# Patient Record
Sex: Female | Born: 1965 | Race: White | Hispanic: No | Marital: Married | Smoking: Current every day smoker
Health system: Southern US, Community
[De-identification: ages and names within clinical notes are randomized; demographics above are authoritative.]

## PROBLEM LIST (undated history)

## (undated) DIAGNOSIS — G4700 Insomnia, unspecified: Secondary | ICD-10-CM

## (undated) DIAGNOSIS — R569 Unspecified convulsions: Secondary | ICD-10-CM

## (undated) DIAGNOSIS — M542 Cervicalgia: Secondary | ICD-10-CM

## (undated) DIAGNOSIS — R350 Frequency of micturition: Secondary | ICD-10-CM

## (undated) DIAGNOSIS — R112 Nausea with vomiting, unspecified: Secondary | ICD-10-CM

## (undated) DIAGNOSIS — J45909 Unspecified asthma, uncomplicated: Secondary | ICD-10-CM

## (undated) DIAGNOSIS — K219 Gastro-esophageal reflux disease without esophagitis: Secondary | ICD-10-CM

## (undated) DIAGNOSIS — R51 Headache: Secondary | ICD-10-CM

## (undated) DIAGNOSIS — Z9889 Other specified postprocedural states: Secondary | ICD-10-CM

## (undated) DIAGNOSIS — G510 Bell's palsy: Secondary | ICD-10-CM

## (undated) DIAGNOSIS — M1990 Unspecified osteoarthritis, unspecified site: Secondary | ICD-10-CM

## (undated) DIAGNOSIS — R519 Headache, unspecified: Secondary | ICD-10-CM

## (undated) DIAGNOSIS — R351 Nocturia: Secondary | ICD-10-CM

## (undated) DIAGNOSIS — M2550 Pain in unspecified joint: Secondary | ICD-10-CM

## (undated) DIAGNOSIS — I10 Essential (primary) hypertension: Secondary | ICD-10-CM

## (undated) DIAGNOSIS — R42 Dizziness and giddiness: Secondary | ICD-10-CM

## (undated) HISTORY — PX: APPENDECTOMY: SHX54

## (undated) HISTORY — PX: CARPAL TUNNEL RELEASE: SHX101

## (undated) HISTORY — PX: CHOLECYSTECTOMY: SHX55

## (undated) HISTORY — PX: OTHER SURGICAL HISTORY: SHX169

## (undated) HISTORY — PX: BACK SURGERY: SHX140

## (undated) HISTORY — PX: ABDOMINAL HYSTERECTOMY: SHX81

## (undated) HISTORY — DX: Headache: R51

## (undated) HISTORY — DX: Bell's palsy: G51.0

## (undated) HISTORY — PX: TONSILLECTOMY: SUR1361

## (undated) HISTORY — DX: Headache, unspecified: R51.9

## (undated) HISTORY — PX: LYMPH NODE BIOPSY: SHX201

---

## 1998-01-18 ENCOUNTER — Ambulatory Visit (HOSPITAL_COMMUNITY): Admission: RE | Admit: 1998-01-18 | Discharge: 1998-01-18 | Payer: Self-pay | Admitting: Obstetrics and Gynecology

## 2009-04-24 ENCOUNTER — Encounter: Payer: Self-pay | Admitting: Internal Medicine

## 2009-04-25 ENCOUNTER — Encounter: Payer: Self-pay | Admitting: Internal Medicine

## 2009-04-25 DIAGNOSIS — R9431 Abnormal electrocardiogram [ECG] [EKG]: Secondary | ICD-10-CM

## 2009-04-26 ENCOUNTER — Ambulatory Visit: Payer: Self-pay | Admitting: Internal Medicine

## 2009-04-26 ENCOUNTER — Encounter: Payer: Self-pay | Admitting: Internal Medicine

## 2009-04-26 ENCOUNTER — Ambulatory Visit (HOSPITAL_COMMUNITY)
Admission: RE | Admit: 2009-04-26 | Discharge: 2009-04-26 | Payer: Self-pay | Source: Home / Self Care | Admitting: Internal Medicine

## 2009-04-26 ENCOUNTER — Ambulatory Visit: Payer: Self-pay | Admitting: Cardiology

## 2009-04-26 ENCOUNTER — Ambulatory Visit: Payer: Self-pay

## 2009-04-26 DIAGNOSIS — I10 Essential (primary) hypertension: Secondary | ICD-10-CM

## 2009-04-27 ENCOUNTER — Telehealth: Payer: Self-pay | Admitting: Internal Medicine

## 2009-04-30 ENCOUNTER — Ambulatory Visit (HOSPITAL_COMMUNITY)
Admission: RE | Admit: 2009-04-30 | Discharge: 2009-04-30 | Payer: Self-pay | Source: Home / Self Care | Admitting: Neurosurgery

## 2010-02-26 NOTE — Progress Notes (Signed)
Summary: Need to know if pt is clear for surgery Monday  Phone Note From Other Clinic   Caller: D.Jenkins /[phone] Darl Pikes Summary of Call: Dr.Jenkins office calling to see if the pt has been clear for surgery Initial call taken by: Judie Grieve,  April 27, 2009 1:35 PM  Follow-up for Phone Call        spoke with Darl Pikes per Dr Ladona Ridgel ok for surgery.  Echo faxed to Dr Dara Lords, RN, BSN  April 27, 2009 1:50 PM     Appended Document: Need to know if pt is clear for surgery Monday per Dr Ladona Ridgel low risk for surgery okay to proceed

## 2010-02-26 NOTE — Letter (Signed)
Summary: [facility]   [facility]   Imported By: Roderic Ovens 06/19/2009 14:34:02  _____________________________________________________________________  External Attachment:    Type:   Image     Comment:   External Document

## 2010-02-26 NOTE — Assessment & Plan Note (Signed)
Summary: sur/abnormal ekg/kfw   History of Present Illness: Megan Ochoa is referred today for pre-op evaluation prior to lumbar spine surgery.  She has a h/o HTN, tobacco abuse and arthritis.  She has a strong family h/o CAD.  She has been bothered by lower back pain for years and has been scheduled for spinal surgery in a few days.  She denies c/p or sob.  She is fairly active performing her activities of daily living without difficulty.  She does not lift anything heavy.  She has never had syncope.  She admits to a h/o tobacco abuse but is trying to stop smoking.  Current Medications (verified): 1)  Hydrocodone-Acetaminophen 7.5-500 Mg/58ml Soln (Hydrocodone-Acetaminophen) .... As Needed 2)  Promethazine Hcl 25 Mg Tabs (Promethazine Hcl) .... As Needed 3)  Omeprazole 20 Mg Cpdr (Omeprazole) .... Take One Tablet By Mouth Once Daily. 4)  Cyclobenzaprine Hcl 10 Mg Tabs (Cyclobenzaprine Hcl) .... Take One Tablet By Mouth Once Daily. 5)  Cymbalta 60 Mg Cpep (Duloxetine Hcl) .... 2 Tablets Once Daily 6)  Diovan 320 Mg Tabs (Valsartan) .... Take One Tablet By Mouth Daily 7)  Trazodone Hcl 50 Mg Tabs (Trazodone Hcl) .... Take One Tablet By Mouth Once Daily. 8)  Lorazepam 0.5 Mg Tabs (Lorazepam) .... As Needed 9)  Meloxicam 15 Mg Tabs (Meloxicam) .... Take One Tablet By Mouth Once Daily. 10)  Proair Hfa 108 (90 Base) Mcg/act Aers (Albuterol Sulfate) .... As Needed 11)  Risperdal 1 Mg Tabs (Risperidone) .... Take One Tablet By Mouth Twice Daily.  Allergies (verified): 1)  ! Lodine  Past History:  Past Medical History: Current Problems:  ABNORMAL ELECTROCARDIOGRAM (ICD-794.31) HTN GERD    Past Surgical History: unknown  Family History: Father and sister with CAD  Social History: Ongoing tobacco abuse Denies ETOH abuse  Review of Systems       All systems reviewed and negative except as noted in the HPI.  Vital Signs:  Patient profile:   45 year old female Height:      67  inches Weight:      228 pounds BMI:     35.84 Pulse rate:   82 / minute BP sitting:   112 / 70  (left arm)  Vitals Entered By: Laurance Flatten CMA (April 26, 2009 8:48 AM)  Physical Exam  General:  Obese, well developed, well nourished, in no acute distress.  HEENT: normal Neck: supple. No JVD. Carotids 2+ bilaterally no bruits Cor: RRR no rubs, gallops or murmur Lungs: CTA Ab: soft, nontender. nondistended. No HSM. Good bowel sounds Ext: warm. no cyanosis, clubbing or edema Neuro: alert and oriented. Grossly nonfocal. affect pleasant    EKG  Procedure date:  04/26/2009  Findings:      Normal sinus rhythm with rate of:  63 with poor R wave progression  Impression & Recommendations:  Problem # 1:  ABNORMAL ELECTROCARDIOGRAM (ICD-794.31) Her abnormal ECG demonstrates poor R wave progression. This may not indicate CAD but I would recommend obtaining a 2D echo to confirm that her LV function is normal. If so, we will allow her to proceed with surgery. Orders: Echocardiogram (Echo)  Problem # 2:  ESSENTIAL HYPERTENSION, BENIGN (ICD-401.1) Her blood pressure is well controlled today and she will continue her current meds. Her updated medication list for this problem includes:    Diovan 320 Mg Tabs (Valsartan) .Marland Kitchen... Take one tablet by mouth daily  Patient Instructions: 1)  Your physician has requested that you have an echocardiogram.  Echocardiography  is a painless test that uses sound waves to create images of your heart. It provides your doctor with information about the size and shape of your heart and how well your heart's chambers and valves are working.  This procedure takes approximately one hour. There are no restrictions for this procedure.

## 2010-04-22 LAB — CBC
HCT: 45.6 % (ref 36.0–46.0)
MCHC: 33.8 g/dL (ref 30.0–36.0)
Platelets: 210 10*3/uL (ref 150–400)
RBC: 5.2 MIL/uL — ABNORMAL HIGH (ref 3.87–5.11)
RDW: 15.2 % (ref 11.5–15.5)
WBC: 9.3 10*3/uL (ref 4.0–10.5)

## 2010-04-22 LAB — BASIC METABOLIC PANEL
BUN: 5 mg/dL — ABNORMAL LOW (ref 6–23)
Calcium: 9.7 mg/dL (ref 8.4–10.5)
Creatinine, Ser: 0.95 mg/dL (ref 0.4–1.2)
GFR calc Af Amer: >60 mL/min (ref >60)
GFR calc non Af Amer: >60 mL/min (ref >60)
Sodium: 138 mEq/L (ref 135–145)

## 2011-08-14 ENCOUNTER — Other Ambulatory Visit: Payer: Self-pay | Admitting: Neurosurgery

## 2011-08-19 ENCOUNTER — Encounter (HOSPITAL_COMMUNITY): Payer: Self-pay | Admitting: Pharmacy Technician

## 2011-08-19 NOTE — Pre-Procedure Instructions (Addendum)
20 AKSHARA BLUMENTHAL  08/19/2011   Your procedure is scheduled on:  Mon, July 29 @ 7:30 AM  Report to [facility] at 5:30 AM.  Call this number if you have problems the morning of surgery: [phone]   Remember:   Do not eat food or drink:After Midnight.    Take these medicines the morning of surgery with A SIP OF WATER: Pain Pill(if needed) and Omeprazole(Prilosec)Estradiol,Tizanidine(Zanaflex)if needed   Do not wear jewelry, make-up or nail polish.  Do not wear lotions, powders, or perfumes.   Do not shave 48 hours prior to surgery.  Do not bring valuables to the hospital.  Contacts, dentures or bridgework may not be worn into surgery.  Leave suitcase in the car. After surgery it may be brought to your room.  For patients admitted to the hospital, checkout time is 11:00 AM the day of discharge.   Patients discharged the day of surgery will not be allowed to drive home.  Special Instructions: CHG Shower Use Special Wash: 1/2 bottle night before surgery and 1/2 bottle morning of surgery.   Please read over the following fact sheets that you were given: Pain Booklet, Coughing and Deep Breathing, Blood Transfusion Information, MRSA Information and Surgical Site Infection Prevention

## 2011-08-20 ENCOUNTER — Encounter (HOSPITAL_COMMUNITY)
Admission: RE | Admit: 2011-08-20 | Discharge: 2011-08-20 | Disposition: A | Discharge status: Home / Self Care | Payer: Medicare Other | Source: Ambulatory Visit | Attending: Neurosurgery | Admitting: Neurosurgery

## 2011-08-20 ENCOUNTER — Encounter (HOSPITAL_COMMUNITY): Payer: Self-pay | Admitting: Vascular Surgery

## 2011-08-20 ENCOUNTER — Ambulatory Visit (HOSPITAL_COMMUNITY)
Admission: RE | Admit: 2011-08-20 | Discharge: 2011-08-20 | Disposition: A | Discharge status: Home / Self Care | Payer: Medicare Other | Source: Ambulatory Visit | Attending: Neurosurgery | Admitting: Neurosurgery

## 2011-08-20 ENCOUNTER — Encounter (HOSPITAL_COMMUNITY): Payer: Self-pay

## 2011-08-20 DIAGNOSIS — Z01812 Encounter for preprocedural laboratory examination: Secondary | ICD-10-CM | POA: Insufficient documentation

## 2011-08-20 DIAGNOSIS — Z01811 Encounter for preprocedural respiratory examination: Secondary | ICD-10-CM | POA: Insufficient documentation

## 2011-08-20 DIAGNOSIS — Z01818 Encounter for other preprocedural examination: Secondary | ICD-10-CM | POA: Insufficient documentation

## 2011-08-20 HISTORY — DX: Nausea with vomiting, unspecified: R11.2

## 2011-08-20 HISTORY — DX: Insomnia, unspecified: G47.00

## 2011-08-20 HISTORY — DX: Frequency of micturition: R35.0

## 2011-08-20 HISTORY — DX: Unspecified convulsions: R56.9

## 2011-08-20 HISTORY — DX: Gastro-esophageal reflux disease without esophagitis: K21.9

## 2011-08-20 HISTORY — DX: Other specified postprocedural states: Z98.890

## 2011-08-20 HISTORY — DX: Nocturia: R35.1

## 2011-08-20 HISTORY — DX: Unspecified asthma, uncomplicated: J45.909

## 2011-08-20 HISTORY — DX: Essential (primary) hypertension: I10

## 2011-08-20 HISTORY — DX: Cervicalgia: M54.2

## 2011-08-20 HISTORY — DX: Dizziness and giddiness: R42

## 2011-08-20 HISTORY — DX: Pain in unspecified joint: M25.50

## 2011-08-20 HISTORY — DX: Unspecified osteoarthritis, unspecified site: M19.90

## 2011-08-20 LAB — BASIC METABOLIC PANEL
BUN: 10 mg/dL (ref 6–23)
Chloride: 96 mEq/L (ref 96–112)
GFR calc Af Amer: 82 mL/min — ABNORMAL LOW (ref >90)
Glucose, Bld: 80 mg/dL (ref 70–99)
Potassium: 4 mEq/L (ref 3.5–5.1)

## 2011-08-20 LAB — ABO/RH: ABO/RH(D): A POS

## 2011-08-20 LAB — CBC
HCT: 46.8 % — ABNORMAL HIGH (ref 36.0–46.0)
Hemoglobin: 16.4 g/dL — ABNORMAL HIGH (ref 12.0–15.0)
MCHC: 35 g/dL (ref 30.0–36.0)
RBC: 5.39 MIL/uL — ABNORMAL HIGH (ref 3.87–5.11)

## 2011-08-20 LAB — TYPE AND SCREEN: Antibody Screen: NEGATIVE

## 2011-08-20 NOTE — Consult Note (Signed)
Anesthesia Consult:  Patient is a 46 year old female scheduled for two level PLIF on 08/25/11 by Dr. Lovell Sheehan.  History includes smoking, obesity with BMI 37, HTN, seizures (not since age 57 and no longer on anti-seizure medications), GERD, asthma, arthritis, dizziness, hysterectomy, right L4-5 diskectomy 04/30/09.  She asked to speak to an Anesthesia representative during her PAT visit to discuss her waking up during a procedure.  Upon further questioning, I learned that the procedure was a coloscopy done at [facility] on 02/13/10.  It was done under sedation (Fentanyl 250 mcg, Versed 15 mg) not general anesthesia.  (Records on her chart.)  She has never experienced awareness under GA.  Prior to her back surgery in 2011 she was evaluated by Cardiologist Dr. Lewayne Bunting for EKG showing poor r wave progression.  He recommended an echocardiogram which was done on 04/26/09 and showed: - Left ventricle: The cavity size was normal. Wall thickness was normal. Systolic function was normal. The estimated ejection fraction was in the range of 55% to 60%. Wall motion was normal; there were no regional wall motion abnormalities. - Pericardium, extracardiac: A trivial pericardial effusion was identified. - Trivial tricuspid and pulmonic regurgitation. She was ultimately cleared for that procedure.  EKG on 08/20/11 showed NSR, possible LAE.  CXR on 08/20/11 showed slightly prominent chronic lung markings. No change since the previous study. No active disease identified.  Labs noted.  Plan to proceed.  Shonna Chock, PA-C

## 2011-08-20 NOTE — Progress Notes (Signed)
Stress Test requested from [facility].

## 2011-08-24 MED ORDER — CEFAZOLIN SODIUM-DEXTROSE 2-3 GM-% IV SOLR
2.0000 g | INTRAVENOUS | Status: AC
  Administered 2011-08-25: 2 g via INTRAVENOUS
  Filled 2011-08-24: qty 50

## 2011-08-25 ENCOUNTER — Inpatient Hospital Stay (HOSPITAL_COMMUNITY)
Admission: RE | Admit: 2011-08-25 | Discharge: 2011-08-28 | DRG: 756 | Disposition: A | Discharge status: Home / Self Care | Payer: BC Managed Care – PPO | Source: Ambulatory Visit | Attending: Neurosurgery | Admitting: Neurosurgery

## 2011-08-25 ENCOUNTER — Encounter (HOSPITAL_COMMUNITY): Payer: Self-pay | Admitting: *Deleted

## 2011-08-25 ENCOUNTER — Encounter (HOSPITAL_COMMUNITY): Payer: Self-pay | Admitting: Vascular Surgery

## 2011-08-25 ENCOUNTER — Ambulatory Visit (HOSPITAL_COMMUNITY): Payer: BC Managed Care – PPO | Admitting: Vascular Surgery

## 2011-08-25 ENCOUNTER — Ambulatory Visit (HOSPITAL_COMMUNITY): Payer: BC Managed Care – PPO

## 2011-08-25 ENCOUNTER — Encounter (HOSPITAL_COMMUNITY)
Admission: RE | Disposition: A | Discharge status: Home / Self Care | Payer: Self-pay | Source: Ambulatory Visit | Attending: Neurosurgery

## 2011-08-25 DIAGNOSIS — M5136 Other intervertebral disc degeneration, lumbar region: Secondary | ICD-10-CM

## 2011-08-25 DIAGNOSIS — F17200 Nicotine dependence, unspecified, uncomplicated: Secondary | ICD-10-CM | POA: Diagnosis present

## 2011-08-25 DIAGNOSIS — M5137 Other intervertebral disc degeneration, lumbosacral region: Principal | ICD-10-CM | POA: Diagnosis present

## 2011-08-25 DIAGNOSIS — M51379 Other intervertebral disc degeneration, lumbosacral region without mention of lumbar back pain or lower extremity pain: Principal | ICD-10-CM | POA: Diagnosis present

## 2011-08-25 DIAGNOSIS — J45909 Unspecified asthma, uncomplicated: Secondary | ICD-10-CM | POA: Diagnosis present

## 2011-08-25 DIAGNOSIS — M51369 Other intervertebral disc degeneration, lumbar region without mention of lumbar back pain or lower extremity pain: Secondary | ICD-10-CM

## 2011-08-25 DIAGNOSIS — K219 Gastro-esophageal reflux disease without esophagitis: Secondary | ICD-10-CM | POA: Diagnosis present

## 2011-08-25 SURGERY — POSTERIOR LUMBAR FUSION 2 LEVEL
Anesthesia: General | Site: Spine Lumbar | Wound class: Clean

## 2011-08-25 MED ORDER — DOCUSATE SODIUM 100 MG PO CAPS
100.0000 mg | ORAL_CAPSULE | Freq: Two times a day (BID) | ORAL | Status: DC
  Administered 2011-08-25 – 2011-08-28 (×7): 100 mg via ORAL
  Filled 2011-08-25 (×7): qty 1

## 2011-08-25 MED ORDER — DEXAMETHASONE SODIUM PHOSPHATE 4 MG/ML IJ SOLN
INTRAMUSCULAR | Status: DC | PRN
  Administered 2011-08-25: 4 mg via INTRAVENOUS

## 2011-08-25 MED ORDER — CEFAZOLIN SODIUM-DEXTROSE 2-3 GM-% IV SOLR
2.0000 g | Freq: Three times a day (TID) | INTRAVENOUS | Status: AC
  Administered 2011-08-25 (×2): 2 g via INTRAVENOUS
  Filled 2011-08-25 (×2): qty 50

## 2011-08-25 MED ORDER — IRBESARTAN 300 MG PO TABS
300.0000 mg | ORAL_TABLET | Freq: Every day | ORAL | Status: DC
  Administered 2011-08-25 – 2011-08-28 (×3): 300 mg via ORAL
  Filled 2011-08-25 (×4): qty 1

## 2011-08-25 MED ORDER — PANTOPRAZOLE SODIUM 40 MG PO TBEC
40.0000 mg | DELAYED_RELEASE_TABLET | Freq: Every day | ORAL | Status: DC
  Administered 2011-08-25 – 2011-08-28 (×4): 40 mg via ORAL
  Filled 2011-08-25 (×5): qty 1

## 2011-08-25 MED ORDER — LIDOCAINE HCL 4 % MT SOLN
OROMUCOSAL | Status: DC | PRN
  Administered 2011-08-25: 4 mL via TOPICAL

## 2011-08-25 MED ORDER — HYDROCHLOROTHIAZIDE 25 MG PO TABS
25.0000 mg | ORAL_TABLET | Freq: Every day | ORAL | Status: DC
  Administered 2011-08-25 – 2011-08-28 (×2): 25 mg via ORAL
  Filled 2011-08-25 (×4): qty 1

## 2011-08-25 MED ORDER — MIDAZOLAM HCL 5 MG/5ML IJ SOLN
INTRAMUSCULAR | Status: DC | PRN
  Administered 2011-08-25: 2 mg via INTRAVENOUS

## 2011-08-25 MED ORDER — BUPIVACAINE LIPOSOME 1.3 % IJ SUSP
INTRAMUSCULAR | Status: DC | PRN
  Administered 2011-08-25: 20 mL

## 2011-08-25 MED ORDER — GLYCOPYRROLATE 0.2 MG/ML IJ SOLN
INTRAMUSCULAR | Status: DC | PRN
  Administered 2011-08-25: .4 mg via INTRAVENOUS

## 2011-08-25 MED ORDER — SODIUM CHLORIDE 0.9 % IJ SOLN: 9.0000 mL | INTRAMUSCULAR | Status: DC | PRN

## 2011-08-25 MED ORDER — DIAZEPAM 5 MG PO TABS
5.0000 mg | ORAL_TABLET | Freq: Four times a day (QID) | ORAL | Status: DC | PRN
  Administered 2011-08-25 – 2011-08-28 (×7): 5 mg via ORAL
  Filled 2011-08-25 (×6): qty 1

## 2011-08-25 MED ORDER — LACTATED RINGERS IV SOLN
INTRAVENOUS | Status: DC | PRN
  Administered 2011-08-25 (×3): via INTRAVENOUS

## 2011-08-25 MED ORDER — EPHEDRINE SULFATE 50 MG/ML IJ SOLN
INTRAMUSCULAR | Status: DC | PRN
  Administered 2011-08-25 (×4): 10 mg via INTRAVENOUS

## 2011-08-25 MED ORDER — VALSARTAN-HYDROCHLOROTHIAZIDE 320-25 MG PO TABS: 1.0000 | ORAL_TABLET | Freq: Every day | ORAL | Status: DC

## 2011-08-25 MED ORDER — DIPHENHYDRAMINE HCL 50 MG/ML IJ SOLN: 12.5000 mg | Freq: Four times a day (QID) | INTRAMUSCULAR | Status: DC | PRN

## 2011-08-25 MED ORDER — SCOPOLAMINE 1 MG/3DAYS TD PT72
MEDICATED_PATCH | TRANSDERMAL | Status: DC | PRN
  Administered 2011-08-25: 1 via TRANSDERMAL

## 2011-08-25 MED ORDER — ACETAMINOPHEN 325 MG PO TABS: 650.0000 mg | ORAL_TABLET | ORAL | Status: DC | PRN

## 2011-08-25 MED ORDER — PHENOL 1.4 % MT LIQD: 1.0000 | OROMUCOSAL | Status: DC | PRN

## 2011-08-25 MED ORDER — 0.9 % SODIUM CHLORIDE (POUR BTL) OPTIME
TOPICAL | Status: DC | PRN
  Administered 2011-08-25: 1000 mL

## 2011-08-25 MED ORDER — ESTRADIOL 1 MG PO TABS
1.5000 mg | ORAL_TABLET | Freq: Every day | ORAL | Status: DC
  Administered 2011-08-25 – 2011-08-28 (×4): 1.5 mg via ORAL
  Filled 2011-08-25 (×4): qty 1.5

## 2011-08-25 MED ORDER — PROMETHAZINE HCL 25 MG PO TABS
25.0000 mg | ORAL_TABLET | Freq: Four times a day (QID) | ORAL | Status: DC | PRN
  Administered 2011-08-25 – 2011-08-27 (×2): 25 mg via ORAL
  Filled 2011-08-25 (×2): qty 1

## 2011-08-25 MED ORDER — ZOLPIDEM TARTRATE 5 MG PO TABS: 5.0000 mg | ORAL_TABLET | Freq: Every evening | ORAL | Status: DC | PRN

## 2011-08-25 MED ORDER — ONDANSETRON HCL 4 MG/2ML IJ SOLN
INTRAMUSCULAR | Status: DC | PRN
  Administered 2011-08-25 (×2): 4 mg via INTRAVENOUS

## 2011-08-25 MED ORDER — MORPHINE SULFATE (PF) 1 MG/ML IV SOLN
INTRAVENOUS | Status: AC
  Filled 2011-08-25: qty 25

## 2011-08-25 MED ORDER — DIAZEPAM 5 MG PO TABS
ORAL_TABLET | ORAL | Status: AC
  Filled 2011-08-25: qty 1

## 2011-08-25 MED ORDER — MORPHINE SULFATE (PF) 1 MG/ML IV SOLN
INTRAVENOUS | Status: DC
  Administered 2011-08-25: 25 mg via INTRAVENOUS
  Administered 2011-08-25 (×2): via INTRAVENOUS
  Administered 2011-08-26: 18 mg via INTRAVENOUS
  Administered 2011-08-26: 04:00:00 via INTRAVENOUS
  Administered 2011-08-26: 5.88 mg via INTRAVENOUS
  Administered 2011-08-26: 18:00:00 via INTRAVENOUS
  Administered 2011-08-26: 7 mg via INTRAVENOUS
  Administered 2011-08-26: 19.5 mg via INTRAVENOUS
  Administered 2011-08-27: 1.5 mg via INTRAVENOUS
  Administered 2011-08-27: 16.5 mg via INTRAVENOUS
  Administered 2011-08-27: 05:00:00 via INTRAVENOUS
  Filled 2011-08-25 (×6): qty 25

## 2011-08-25 MED ORDER — NEOSTIGMINE METHYLSULFATE 1 MG/ML IJ SOLN
INTRAMUSCULAR | Status: DC | PRN
  Administered 2011-08-25: 3 mg via INTRAVENOUS

## 2011-08-25 MED ORDER — TRAZODONE HCL 50 MG PO TABS
50.0000 mg | ORAL_TABLET | Freq: Every day | ORAL | Status: DC
  Administered 2011-08-25 – 2011-08-27 (×3): 50 mg via ORAL
  Filled 2011-08-25 (×4): qty 1

## 2011-08-25 MED ORDER — BUPIVACAINE LIPOSOME 1.3 % IJ SUSP
8.0000 mL | Freq: Once | INTRAMUSCULAR | Status: DC
  Filled 2011-08-25: qty 20

## 2011-08-25 MED ORDER — ONDANSETRON HCL 4 MG/2ML IJ SOLN
INTRAMUSCULAR | Status: AC
  Filled 2011-08-25: qty 2

## 2011-08-25 MED ORDER — BUPIVACAINE LIPOSOME 1.3 % IJ SUSP
20.0000 mL | Freq: Once | INTRAMUSCULAR | Status: DC
  Filled 2011-08-25: qty 20

## 2011-08-25 MED ORDER — HYDROMORPHONE HCL PF 1 MG/ML IJ SOLN
INTRAMUSCULAR | Status: AC
  Filled 2011-08-25: qty 1

## 2011-08-25 MED ORDER — LIDOCAINE HCL (CARDIAC) 20 MG/ML IV SOLN
INTRAVENOUS | Status: DC | PRN
  Administered 2011-08-25: 100 mg via INTRAVENOUS

## 2011-08-25 MED ORDER — MENTHOL 3 MG MT LOZG: 1.0000 | LOZENGE | OROMUCOSAL | Status: DC | PRN

## 2011-08-25 MED ORDER — ONDANSETRON HCL 4 MG/2ML IJ SOLN
4.0000 mg | INTRAMUSCULAR | Status: DC | PRN
  Administered 2011-08-25 – 2011-08-26 (×3): 4 mg via INTRAVENOUS
  Filled 2011-08-25 (×3): qty 2

## 2011-08-25 MED ORDER — OXYCODONE-ACETAMINOPHEN 5-325 MG PO TABS
1.0000 | ORAL_TABLET | ORAL | Status: DC | PRN
  Administered 2011-08-27 – 2011-08-28 (×4): 2 via ORAL
  Filled 2011-08-25 (×4): qty 2

## 2011-08-25 MED ORDER — THROMBIN 20000 UNITS EX KIT
PACK | CUTANEOUS | Status: DC | PRN
  Administered 2011-08-25: 09:00:00 via TOPICAL

## 2011-08-25 MED ORDER — HYDROCODONE-ACETAMINOPHEN 5-325 MG PO TABS
1.0000 | ORAL_TABLET | ORAL | Status: DC | PRN
  Administered 2011-08-27 – 2011-08-28 (×4): 2 via ORAL
  Filled 2011-08-25 (×4): qty 2

## 2011-08-25 MED ORDER — HYDROMORPHONE HCL PF 1 MG/ML IJ SOLN
0.2500 mg | INTRAMUSCULAR | Status: DC | PRN
  Administered 2011-08-25 (×4): 0.5 mg via INTRAVENOUS

## 2011-08-25 MED ORDER — ONDANSETRON HCL 4 MG/2ML IJ SOLN: 4.0000 mg | Freq: Four times a day (QID) | INTRAMUSCULAR | Status: DC | PRN

## 2011-08-25 MED ORDER — ONDANSETRON HCL 4 MG/2ML IJ SOLN
4.0000 mg | Freq: Once | INTRAMUSCULAR | Status: AC | PRN
  Administered 2011-08-25: 4 mg via INTRAVENOUS

## 2011-08-25 MED ORDER — ROCURONIUM BROMIDE 100 MG/10ML IV SOLN
INTRAVENOUS | Status: DC | PRN
  Administered 2011-08-25: 30 mg via INTRAVENOUS
  Administered 2011-08-25: 50 mg via INTRAVENOUS

## 2011-08-25 MED ORDER — BACITRACIN ZINC 500 UNIT/GM EX OINT
TOPICAL_OINTMENT | CUTANEOUS | Status: DC | PRN
  Administered 2011-08-25: 1 via TOPICAL

## 2011-08-25 MED ORDER — SODIUM CHLORIDE 0.9 % IV SOLN
INTRAVENOUS | Status: AC
  Filled 2011-08-25: qty 500

## 2011-08-25 MED ORDER — NALOXONE HCL 0.4 MG/ML IJ SOLN: 0.4000 mg | INTRAMUSCULAR | Status: DC | PRN

## 2011-08-25 MED ORDER — SODIUM CHLORIDE 0.9 % IR SOLN
Status: DC | PRN
  Administered 2011-08-25: 09:00:00

## 2011-08-25 MED ORDER — SUFENTANIL CITRATE 50 MCG/ML IV SOLN
INTRAVENOUS | Status: DC | PRN
  Administered 2011-08-25 (×4): 10 ug via INTRAVENOUS
  Administered 2011-08-25: 20 ug via INTRAVENOUS
  Administered 2011-08-25: 10 ug via INTRAVENOUS

## 2011-08-25 MED ORDER — DIPHENHYDRAMINE HCL 12.5 MG/5ML PO ELIX: 12.5000 mg | ORAL_SOLUTION | Freq: Four times a day (QID) | ORAL | Status: DC | PRN

## 2011-08-25 MED ORDER — BUPIVACAINE-EPINEPHRINE PF 0.5-1:200000 % IJ SOLN
INTRAMUSCULAR | Status: DC | PRN
  Administered 2011-08-25: 10 mL

## 2011-08-25 MED ORDER — LACTATED RINGERS IV SOLN
INTRAVENOUS | Status: DC
  Administered 2011-08-25 – 2011-08-26 (×2): via INTRAVENOUS

## 2011-08-25 MED ORDER — BACITRACIN 50000 UNITS IM SOLR
INTRAMUSCULAR | Status: AC
  Filled 2011-08-25: qty 1

## 2011-08-25 MED ORDER — ALBUTEROL SULFATE HFA 108 (90 BASE) MCG/ACT IN AERS
2.0000 | INHALATION_SPRAY | Freq: Four times a day (QID) | RESPIRATORY_TRACT | Status: DC | PRN
  Filled 2011-08-25: qty 6.7

## 2011-08-25 MED ORDER — DROPERIDOL 2.5 MG/ML IJ SOLN
INTRAMUSCULAR | Status: DC | PRN
  Administered 2011-08-25: 0.625 mg via INTRAVENOUS

## 2011-08-25 MED ORDER — PROPOFOL 10 MG/ML IV EMUL
INTRAVENOUS | Status: DC | PRN
  Administered 2011-08-25: 200 mg via INTRAVENOUS

## 2011-08-25 MED ORDER — ACETAMINOPHEN 650 MG RE SUPP: 650.0000 mg | RECTAL | Status: DC | PRN

## 2011-08-25 SURGICAL SUPPLY — 68 items
BAG DECANTER FOR FLEXI CONT (MISCELLANEOUS) ×2 IMPLANT
BENZOIN TINCTURE PRP APPL 2/3 (GAUZE/BANDAGES/DRESSINGS) ×2 IMPLANT
BLADE SURG ROTATE 9660 (MISCELLANEOUS) IMPLANT
BRUSH SCRUB EZ PLAIN DRY (MISCELLANEOUS) ×2 IMPLANT
BUR ACORN 6.0 (BURR) ×2 IMPLANT
BUR MATCHSTICK NEURO 3.0 LAGG (BURR) ×2 IMPLANT
CANISTER SUCTION 2500CC (MISCELLANEOUS) ×2 IMPLANT
CAP REVERE LOCKING (Cap) ×12 IMPLANT
CLOTH BEACON ORANGE TIMEOUT ST (SAFETY) ×2 IMPLANT
CONT SPEC 4OZ CLIKSEAL STRL BL (MISCELLANEOUS) ×4 IMPLANT
COVER BACK TABLE 24X17X13 BIG (DRAPES) ×2 IMPLANT
COVER TABLE BACK 60X90 (DRAPES) IMPLANT
DRAPE C-ARM 42X72 X-RAY (DRAPES) ×4 IMPLANT
DRAPE LAPAROTOMY 100X72X124 (DRAPES) ×2 IMPLANT
DRAPE POUCH INSTRU U-SHP 10X18 (DRAPES) ×2 IMPLANT
DRAPE SURG 17X23 STRL (DRAPES) ×8 IMPLANT
ELECT BLADE 4.0 EZ CLEAN MEGAD (MISCELLANEOUS) ×2
ELECT REM PT RETURN 9FT ADLT (ELECTROSURGICAL) ×2
ELECTRODE BLDE 4.0 EZ CLN MEGD (MISCELLANEOUS) ×1 IMPLANT
ELECTRODE REM PT RTRN 9FT ADLT (ELECTROSURGICAL) ×1 IMPLANT
GAUZE SPONGE 4X4 16PLY XRAY LF (GAUZE/BANDAGES/DRESSINGS) ×2 IMPLANT
GLOVE BIO SURGEON STRL SZ8.5 (GLOVE) ×4 IMPLANT
GLOVE BIOGEL PI IND STRL 7.0 (GLOVE) ×2 IMPLANT
GLOVE BIOGEL PI IND STRL 8 (GLOVE) ×1 IMPLANT
GLOVE BIOGEL PI INDICATOR 7.0 (GLOVE) ×2
GLOVE BIOGEL PI INDICATOR 8 (GLOVE) ×1
GLOVE ECLIPSE 6.5 STRL STRAW (GLOVE) ×2 IMPLANT
GLOVE ECLIPSE 7.5 STRL STRAW (GLOVE) ×2 IMPLANT
GLOVE EXAM NITRILE LRG STRL (GLOVE) IMPLANT
GLOVE EXAM NITRILE MD LF STRL (GLOVE) ×2 IMPLANT
GLOVE EXAM NITRILE XL STR (GLOVE) IMPLANT
GLOVE EXAM NITRILE XS STR PU (GLOVE) IMPLANT
GLOVE SS BIOGEL STRL SZ 6.5 (GLOVE) ×2 IMPLANT
GLOVE SS BIOGEL STRL SZ 8 (GLOVE) ×2 IMPLANT
GLOVE SUPERSENSE BIOGEL SZ 6.5 (GLOVE) ×2
GLOVE SUPERSENSE BIOGEL SZ 8 (GLOVE) ×2
GOWN BRE IMP SLV AUR LG STRL (GOWN DISPOSABLE) ×4 IMPLANT
GOWN BRE IMP SLV AUR XL STRL (GOWN DISPOSABLE) ×4 IMPLANT
GOWN STRL REIN 2XL LVL4 (GOWN DISPOSABLE) ×2 IMPLANT
KIT BASIN OR (CUSTOM PROCEDURE TRAY) ×2 IMPLANT
KIT ROOM TURNOVER OR (KITS) ×2 IMPLANT
NEEDLE HYPO 21X1.5 SAFETY (NEEDLE) ×6 IMPLANT
NEEDLE HYPO 22GX1.5 SAFETY (NEEDLE) ×2 IMPLANT
NS IRRIG 1000ML POUR BTL (IV SOLUTION) ×2 IMPLANT
PACK FOAM VITOSS 10CC (Orthopedic Implant) ×2 IMPLANT
PACK LAMINECTOMY NEURO (CUSTOM PROCEDURE TRAY) ×2 IMPLANT
PAD ARMBOARD 7.5X6 YLW CONV (MISCELLANEOUS) ×10 IMPLANT
PATTIES SURGICAL .5 X1 (DISPOSABLE) IMPLANT
PUTTY 10ML ACTIFUSE ABX (Putty) ×2 IMPLANT
ROD REVERE 7.5MM (Rod) ×4 IMPLANT
SCREW 7.5X50MM (Screw) ×8 IMPLANT
SCREW REVERE 6.35 7.5X40 (Screw) ×4 IMPLANT
SPACER SUSTAIN O 10X26 8MM (Spacer) ×8 IMPLANT
SPONGE GAUZE 4X4 12PLY (GAUZE/BANDAGES/DRESSINGS) ×2 IMPLANT
SPONGE LAP 4X18 X RAY DECT (DISPOSABLE) IMPLANT
SPONGE NEURO XRAY DETECT 1X3 (DISPOSABLE) IMPLANT
SPONGE SURGIFOAM ABS GEL 100 (HEMOSTASIS) ×2 IMPLANT
STRIP CLOSURE SKIN 1/2X4 (GAUZE/BANDAGES/DRESSINGS) ×2 IMPLANT
SUT VIC AB 1 CT1 18XBRD ANBCTR (SUTURE) ×2 IMPLANT
SUT VIC AB 1 CT1 8-18 (SUTURE) ×2
SUT VIC AB 2-0 CP2 18 (SUTURE) ×4 IMPLANT
SYR 20CC LL (SYRINGE) ×2 IMPLANT
SYR 20ML ECCENTRIC (SYRINGE) ×2 IMPLANT
TAPE CLOTH SURG 4X10 WHT LF (GAUZE/BANDAGES/DRESSINGS) ×2 IMPLANT
TOWEL OR 17X24 6PK STRL BLUE (TOWEL DISPOSABLE) ×2 IMPLANT
TOWEL OR 17X26 10 PK STRL BLUE (TOWEL DISPOSABLE) ×2 IMPLANT
TRAY FOLEY CATH 14FRSI W/METER (CATHETERS) ×2 IMPLANT
WATER STERILE IRR 1000ML POUR (IV SOLUTION) ×2 IMPLANT

## 2011-08-25 NOTE — Progress Notes (Signed)
UR COMPLETED  

## 2011-08-25 NOTE — H&P (Signed)
Subjective: The patient is a 46 year old white female who was previously undergone lumbar surgery. She has had chronic back and leg pain. She has failed medical management. She was further worked up with a lumbar MRI which demonstrated this degeneration and stenosis at L4-5 and L5-S1. I discussed the various treatment options with the patient including a 2 level lumbar decompression, instrumentation, and fusion. The patient has weighed the risks, benefits, and alternatives surgery and decided proceed with the operation.  Past Medical History  Diagnosis Date  . Hypertension   . Asthma   . Seizures     none since age 57,not on medication  . Arthritis   . GERD (gastroesophageal reflux disease)   . Urinary frequency   . Nocturia   . Joint pain   . Insomnia   . Neck pain, bilateral   . Dizziness   . PONV (postoperative nausea and vomiting)     woke up during a colonoscopy done under sedation    Past Surgical History  Procedure Date  . Appendectomy   . Tonsillectomy   . Carpal tunnel release   . Cesarean section   . Abdominal hysterectomy   . Cholecystectomy   . Lymph node biopsy   . Back surgery   . Right arm bone fusion     Allergies  Allergen Reactions  . Codeine     nausea  . Detrol (Tolterodine)     Chest pain  . Etodolac     Throat swelling, hives  . Metoprolol Succinate (Metoprolol)     Chest pain    History  Substance Use Topics  . Smoking status: Current Everyday Smoker -- 1.0 packs/day for 20 years    Types: Cigarettes  . Smokeless tobacco: Not on file  . Alcohol Use: No    No family history on file. Prior to Admission medications   Medication Sig Start Date End Date Taking? Authorizing Provider  albuterol (PROVENTIL HFA;VENTOLIN HFA) 108 (90 BASE) MCG/ACT inhaler Inhale 2 puffs into the lungs every 6 (six) hours as needed. For shortness of breath   Yes Historical Provider, MD  estradiol (ESTRACE) 1 MG tablet Take 1.5 mg by mouth daily.   Yes Historical  Provider, MD  HYDROcodone-acetaminophen (NORCO) 10-325 MG per tablet Take 1 tablet by mouth every 8 (eight) hours as needed. Back and neck pain   Yes Historical Provider, MD  ibuprofen (ADVIL,MOTRIN) 200 MG tablet Take 400 mg by mouth every 6 (six) hours as needed. Stiff back   Yes Historical Provider, MD  meloxicam (MOBIC) 15 MG tablet Take 15 mg by mouth every evening.   Yes Historical Provider, MD  omeprazole (PRILOSEC) 20 MG capsule Take 20 mg by mouth daily.   Yes Historical Provider, MD  promethazine (PHENERGAN) 25 MG tablet Take 25 mg by mouth every 6 (six) hours as needed. For nausea   Yes Historical Provider, MD  tiZANidine (ZANAFLEX) 4 MG capsule Take 4 mg by mouth 2 (two) times daily as needed. Leg cramps   Yes Historical Provider, MD  traZODone (DESYREL) 50 MG tablet Take 50 mg by mouth at bedtime.   Yes Historical Provider, MD  valsartan-hydrochlorothiazide (DIOVAN-HCT) 320-25 MG per tablet Take 1 tablet by mouth daily.   Yes Historical Provider, MD  fluconazole (DIFLUCAN) 50 MG tablet Take 50 mg by mouth daily as needed.    Historical Provider, MD     Review of Systems  Positive ROS: As above  All other systems have been reviewed and were otherwise negative  with the exception of those mentioned in the HPI and as above.  Objective: Vital signs in last 24 hours: Temp:  [97.7 F (36.5 C)] 97.7 F (36.5 C) (07/29 0616) Pulse Rate:  [63] 63  (07/29 0616) Resp:  [20] 20  (07/29 0616) BP: (118)/(82) 118/82 mmHg (07/29 0616) SpO2:  [100 %] 100 % (07/29 0616)  General Appearance: Alert, cooperative, no distress, appears stated age Head: Normocephalic, without obvious abnormality, atraumatic Eyes: PERRL, conjunctiva/corneas clear, EOM's intact, fundi benign, both eyes      Ears: Normal TM's and external ear canals, both ears Throat: Lips, mucosa, and tongue normal; teeth and gums normal Neck: Supple, symmetrical, trachea midline, no adenopathy; thyroid: No  enlargement/tenderness/nodules; no carotid bruit or JVD Back: Symmetric, no curvature, ROM normal, no CVA tenderness the patient's lumbar incision is well-healed. Lungs: Clear to auscultation bilaterally, respirations unlabored Heart: Regular rate and rhythm, S1 and S2 normal, no murmur, rub or gallop Abdomen: Soft, non-tender, bowel sounds active all four quadrants, no masses, no organomegaly Extremities: Extremities normal, atraumatic, no cyanosis or edema Pulses: 2+ and symmetric all extremities Skin: Skin color, texture, turgor normal, no rashes or lesions  NEUROLOGIC:   Mental status: alert and oriented, no aphasia, good attention span, Fund of knowledge/ memory ok Motor Exam - grossly normal Sensory Exam - grossly normal Reflexes: Symmetric Coordination - grossly normal Gait - grossly normal Balance - grossly normal Cranial Nerves: I: smell Not tested  II: visual acuity  OS: Normal    OD: Normal   II: visual fields Full to confrontation  II: pupils Equal, round, reactive to light  III,VII: ptosis None  III,IV,VI: extraocular muscles  Full ROM  V: mastication Normal  V: facial light touch sensation  Normal  V,VII: corneal reflex  Present  VII: facial muscle function - upper  Normal  VII: facial muscle function - lower Normal  VIII: hearing Not tested  IX: soft palate elevation  Normal  IX,X: gag reflex Present  XI: trapezius strength  5/5  XI: sternocleidomastoid strength 5/5  XI: neck flexion strength  5/5  XII: tongue strength  Normal    Data Review Lab Results  Component Value Date   WBC 11.3* 08/20/2011   HGB 16.4* 08/20/2011   HCT 46.8* 08/20/2011   MCV 86.8 08/20/2011   PLT 240 08/20/2011   Lab Results  Component Value Date   NA 136 08/20/2011   K 4.0 08/20/2011   CL 96 08/20/2011   CO2 27 08/20/2011   BUN 10 08/20/2011   CREATININE 0.96 08/20/2011   GLUCOSE 80 08/20/2011   No results found for this basename: INR, PROTIME    Assessment/Plan:  L4-5 and  L5-S1 disc degeneration, stenosis, lumbar radiculopathy, lumbago: I discussed situation with the patient. I reviewed her MR scan with her and pointed out the abnormalities. We have discussed the various treatment options including a L4-5 and L5-S1 decompression, instrumentation, and fusion. I described the surgery to her. I've shown her surgical models. We have discussed the risks, benefits, alternatives and likelihood of achieving our goals with surgery. I have answered all the patient's questions. She wants to proceed with the operation.   Megan Ochoa 08/25/2011 7:18 AM

## 2011-08-25 NOTE — Anesthesia Postprocedure Evaluation (Signed)
  Anesthesia Post-op Note  Patient: Megan Ochoa  Procedure(s) Performed: Procedure(s) (LRB): POSTERIOR LUMBAR FUSION 2 LEVEL (N/A)  Patient Location: PACU  Anesthesia Type: General  Level of Consciousness: awake, alert , oriented and patient cooperative  Airway and Oxygen Therapy: Patient Spontanous Breathing and Patient connected to nasal cannula oxygen  Post-op Pain: moderate  Post-op Assessment: Post-op Vital signs reviewed, Patient's Cardiovascular Status Stable, Respiratory Function Stable, Patent Airway, No signs of Nausea or vomiting and Pain level controlled  Post-op Vital Signs: stable  Complications: No apparent anesthesia complications

## 2011-08-25 NOTE — Transfer of Care (Signed)
Immediate Anesthesia Transfer of Care Note  Patient: Megan Ochoa  Procedure(s) Performed: Procedure(s) (LRB): POSTERIOR LUMBAR FUSION 2 LEVEL (N/A)  Patient Location: PACU  Anesthesia Type: General  Level of Consciousness: sedated, patient cooperative and responds to stimulation  Airway & Oxygen Therapy: Patient Spontanous Breathing and Patient connected to nasal cannula oxygen  Post-op Assessment: Report given to PACU RN, Post -op Vital signs reviewed and stable and Patient moving all extremities  Post vital signs: Reviewed and stable  Complications: No apparent anesthesia complications

## 2011-08-25 NOTE — Progress Notes (Signed)
Pt very uncomfortable and thrashing on arrival, pt. Now falling asleep , wakes up and states shes hurting, then falls back asleep.

## 2011-08-25 NOTE — Anesthesia Preprocedure Evaluation (Addendum)
Anesthesia Evaluation  Patient identified by MRN, date of birth, ID band Patient awake    Reviewed: Allergy & Precautions, H&P , NPO status , Patient's Chart, lab work & pertinent test results  History of Anesthesia Complications (+) PONV  Airway Mallampati: II  Neck ROM: full    Dental  (+) Teeth Intact and Dental Advidsory Given   Pulmonary asthma , Current Smoker,  Smokes 1/2 pk/day X 94yrs         Cardiovascular Exercise Tolerance: Good hypertension, On Medications Rhythm:regular Rate:Normal     Neuro/Psych Seizures -, Well Controlled,  negative psych ROS   GI/Hepatic Neg liver ROS, GERD-  Medicated and Controlled,  Endo/Other  negative endocrine ROS  Renal/GU negative Renal ROS  negative genitourinary   Musculoskeletal   Abdominal   Peds  Hematology negative hematology ROS (+)   Anesthesia Other Findings   Reproductive/Obstetrics negative OB ROS                          Anesthesia Physical Anesthesia Plan  ASA: III  Anesthesia Plan: General ETT   Post-op Pain Management:    Induction: Intravenous  Airway Management Planned: Oral ETT  Additional Equipment:   Intra-op Plan:   Post-operative Plan: Extubation in OR  Informed Consent: I have reviewed the patients History and Physical, chart, labs and discussed the procedure including the risks, benefits and alternatives for the proposed anesthesia with the patient or authorized representative who has indicated his/her understanding and acceptance.   Dental Advisory Given  Plan Discussed with: Anesthesiologist, CRNA and Surgeon  Anesthesia Plan Comments:        Anesthesia Quick Evaluation

## 2011-08-25 NOTE — Op Note (Signed)
Brief history: The patient is a 46 year old white female who's had prior back surgeries. She's had persistent back and leg pain consistent with a lumbar radiculopathy. She has failed medical management and was worked up with a lumbar MRI. This demonstrated the patient had this degeneration and foraminal stenosis at L4-5 and L5-S1. I discussed the various treatment options with the patient including surgery. She has weighed the risks, benefits, and alternatives surgery decided proceed with a redo L4-5 and L5-S1  laminectomy with instrumentation and fusion.  Preoperative diagnosis: L4-5 and L5-S1 Degenerative disc disease, spinal stenosis with compression of the bilateral L4, L5 and S1 nerve roots; lumbago; lumbar radiculopathy  Postoperative diagnosis: The same  Procedure: Bilateral L4 and L5 Laminotomy/foraminotomies to decompress the bilateral L4, L5 and S1 nerve roots(the work required to do this was in addition to the work required to do the posterior lumbar interbody fusion because of the patient's spinal stenosis, facet arthropathy. Etc. requiring a wide decompression of the nerve roots.); L4-5 and L5-S1 posterior lumbar interbody fusion with local morselized autograft bone and Actifusebone graft extender; insertion of interbody prosthesis at L4-5 and L5-S1 (globus peek interbody prosthesis); posterior segmental instrumentation from L4 to S1 with globus titanium pedicle screws and rods; posterior lateral arthrodesis at L4-5 and L5-S1 with local morselized autograft bone and Vitoss bone graft extender.  Surgeon: Dr. Delma Officer  Asst.: Dr. Barbaraann Barthel  Anesthesia: Gen. endotracheal  Estimated blood loss: 250 cc  Drains: None  Locations: None  Description of procedure: The patient was brought to the operating room by the anesthesia team. General endotracheal anesthesia was induced. The patient was turned to the prone position on the Wilson frame. The patient's lumbosacral region was then  prepared with Betadine scrub and Betadine solution. Sterile drapes were applied.  I then injected the area to be incised with Marcaine with epinephrine solution. I then used the scalpel to make a linear midline incision over the L4-5 and L5-S1 interspace. I then used electrocautery to perform a bilateral subperiosteal dissection exposing the spinous process and lamina of L3 down to S1. We encountered scar tissue from the prior operation. We then obtained intraoperative radiograph to confirm our location. We then inserted the Verstrac retractor to provide exposure.  I began the decompression by using the high speed drill to perform laminotomies at L4 and L5 bilaterally. We then used the Kerrison punches to widen the laminotomy and removed the ligamentum flavum at L4-5 and L5-S1 as well as discectomy scar tissue at these levels on the right. We used the Kerrison punches to remove the medial facets at L4-5 and L5-S1. We performed wide foraminotomies about the bilateral L4, L5 and S1 nerve roots completing the decompression.  We now turned our attention to the posterior lumbar interbody fusion. I used a scalpel to incise the intervertebral disc at L4-5 and L5-S1. I then performed a partial intervertebral discectomy at L4-5 and L5-S1 using the pituitary forceps. We prepared the vertebral endplates at L4-5 and L5-S1 for the fusion by removing the soft tissues with the curettes. We then used the trial spacers to pick the appropriate sized interbody prosthesis. We prefilled his prosthesis with a combination of local morselized autograft bone that we obtained during the decompression as well as Actifuse bone graft extender. We inserted the prefilled prosthesis into the interspace at L4-5 and L5-S1. There was a good snug fit of the prosthesis in the interspace. We then filled and the remainder of the intervertebral disc space with local morselized  autograft bone and Actifuse. This completed the posterior lumbar  interbody arthrodesis.  We now turned attention to the instrumentation. Under fluoroscopic guidance we cannulated the bilateral L4, L5 and S1 pedicles with the bone probe. We then removed the bone probe. He then tapped the pedicle with a 6.5 millimeter tap. We then removed the tap. We probed inside the tapped pedicle with a ball probe to rule out cortical breaches. We then inserted a 10.5 x 50 and 7.5 x 40 millimeter pedicle screw into the bilateral L4, L5 and S1 pedicles bilaterally under fluoroscopic guidance. We then palpated along the medial aspect of the pedicles to rule out cortical breaches. There were none. The nerve roots were not injured. We then connected the unilateral pedicle screws with a lordotic rod. We compressed the construct and secured the rod in place with the caps. We then tightened the caps appropriately. This completed the instrumentation from L4-S1.  We now turned our attention to the posterior lateral arthrodesis at L4-5 and L5-S1. We used the high-speed drill to decorticate the remainder of the facets, pars, transverse process at L4-5 and L5-S1. We then applied a combination of local morselized autograft bone and Vitoss bone graft extender over these decorticated posterior lateral structures. This completed the posterior lateral arthrodesis.  We then obtained hemostasis using bipolar electrocautery. We irrigated the wound out with bacitracin solution. We inspected the thecal sac and nerve roots and noted they were well decompressed. We then removed the retractor. We reapproximated patient's thoracolumbar fascia with interrupted #1 Vicryl suture. We reapproximated patient's subcutaneous tissue with interrupted 2-0 Vicryl suture. The reapproximated patient's skin with Steri-Strips and benzoin. The wound was then coated with bacitracin ointment. A sterile dressing was applied. The drapes were removed. The patient was subsequently returned to the supine position where they were extubated  by the anesthesia team. He was then transported to the post anesthesia care unit in stable condition. All sponge instrument and needle counts were correct at the end of this case.

## 2011-08-26 LAB — BASIC METABOLIC PANEL
BUN: 8 mg/dL (ref 6–23)
Calcium: 9.2 mg/dL (ref 8.4–10.5)
Creatinine, Ser: 0.81 mg/dL (ref 0.50–1.10)
GFR calc Af Amer: >90 mL/min (ref >90)
GFR calc non Af Amer: 86 mL/min — ABNORMAL LOW (ref >90)
Glucose, Bld: 97 mg/dL (ref 70–99)
Potassium: 3.7 mEq/L (ref 3.5–5.1)

## 2011-08-26 LAB — CBC
HCT: 40.3 % (ref 36.0–46.0)
Hemoglobin: 13.5 g/dL (ref 12.0–15.0)
MCH: 29.4 pg (ref 26.0–34.0)
MCHC: 33.5 g/dL (ref 30.0–36.0)
RDW: 14.3 % (ref 11.5–15.5)

## 2011-08-26 NOTE — Progress Notes (Signed)
RN notified of low BP 

## 2011-08-26 NOTE — Evaluation (Signed)
Occupational Therapy Evaluation Patient Details Name: Megan Ochoa MRN: [id number] DOB: December 23, 1965 Today's Date: 08/26/2011 Time: 4098-1191 OT Time Calculation (min): 23 min  OT Assessment / Plan / Recommendation Clinical Impression  This 46 y.o. female admitted for L4 - S1 fusion.  Pt. is doing very well first day post op.  Currently she is requiring supervision for basic self care activities.  Anticipate good progress    OT Assessment  Patient needs continued OT Services    Follow Up Recommendations  No OT follow up;Supervision - Intermittent    Barriers to Discharge None    Equipment Recommendations  3 in 1 bedside comode;Rolling walker with 5" wheels    Recommendations for Other Services    Frequency  Min 2X/week    Precautions / Restrictions Precautions Precautions: Back Precaution Booklet Issued: Yes (comment) Precaution Comments: reviewed 3/3 back precautions with pt and handout given Required Braces or Orthoses: Spinal Brace Spinal Brace: Lumbar corset Restrictions Weight Bearing Restrictions: No       ADL  Eating/Feeding: Simulated;Independent Where Assessed - Eating/Feeding: Bed level Grooming: Simulated;Wash/dry hands;Wash/dry face;Teeth care;Supervision/safety Where Assessed - Grooming: Supported standing Upper Body Bathing: Simulated;Set up Where Assessed - Upper Body Bathing: Supported sitting Lower Body Bathing: Simulated;Supervision/safety Where Assessed - Lower Body Bathing: Supported sit to stand Upper Body Dressing: Simulated;Supervision/safety Where Assessed - Upper Body Dressing: Unsupported sitting Lower Body Dressing: Simulated;Performed;Supervision/safety Where Assessed - Lower Body Dressing: Supported sit to Pharmacist, hospital: Buyer, retail Method: Sit to Barista: Raised toilet seat with arms (or 3-in-1 over toilet) Toileting - Clothing Manipulation and Hygiene:  Simulated;Supervision/safety Where Assessed - Engineer, mining and Hygiene: Standing Tub/Shower Transfer: Simulated;Min guard Tub/Shower Transfer Method: Science writer: Walk in Scientist, research (physical sciences) Used: Back brace;Rolling walker Transfers/Ambulation Related to ADLs: supervision ADL Comments: Pt. demonstrates good awareness of back precautions and able to verbalize 3/3 precautions.  Pt. able to cross ankles over knees to don/doff socks.  Pt. instructed in safe technique for LB ADLs.      OT Diagnosis: Generalized weakness;Acute pain  OT Problem List: Decreased strength;Decreased activity tolerance;Pain;Decreased knowledge of use of DME or AE;Decreased knowledge of precautions OT Treatment Interventions: Self-care/ADL training;DME and/or AE instruction;Therapeutic activities;Patient/family education   OT Goals Acute Rehab OT Goals OT Goal Formulation: With patient Time For Goal Achievement: 09/02/11 Potential to Achieve Goals: Good ADL Goals Pt Will Perform Upper Body Bathing: with modified independence;Sitting in shower ADL Goal: Upper Body Bathing - Progress: Goal set today Pt Will Perform Lower Body Bathing: with modified independence;Sit to stand in shower ADL Goal: Lower Body Bathing - Progress: Goal set today Pt Will Perform Upper Body Dressing: with modified independence;Sitting, chair;Sitting, bed ADL Goal: Upper Body Dressing - Progress: Progressing toward goals Pt Will Perform Lower Body Dressing: with modified independence;Sit to stand from bed;Sit to stand from chair ADL Goal: Lower Body Dressing - Progress: Progressing toward goals Pt Will Transfer to Toilet: with modified independence;Ambulation;3-in-1 ADL Goal: Toilet Transfer - Progress: Goal set today Pt Will Perform Toileting - Hygiene: with modified independence;with adaptive equipment;Sit to stand from 3-in-1/toilet ADL Goal: Toileting - Hygiene - Progress: Goal set today Pt Will  Perform Tub/Shower Transfer: with supervision;Ambulation ADL Goal: Tub/Shower Transfer - Progress: Goal set today  Visit Information  Last OT Received On: 08/26/11 Assistance Needed: +1    Subjective Data  Subjective: "I feel really good, except being shakey due to not eating" Patient Stated Goal: To get better   Prior Functioning  Vision/Perception  Home Living Lives With: Spouse;Other (Comment);Son;Daughter (truck driver) Available Help at Discharge: Family;Available 24 hours/day Type of Home: House Home Access: Stairs to enter [employer] of Steps: 4-5 Entrance Stairs-Rails: Right;Left Home Layout: One level Bathroom Shower/Tub: Health visitor: Standard Bathroom Accessibility: Yes How Accessible: Accessible via walker Home Adaptive Equipment: None Prior Function Level of Independence: Independent Able to Take Stairs?: Yes Driving: Yes Vocation: On disability Communication Communication: No difficulties      Cognition  Overall Cognitive Status: Appears within functional limits for tasks assessed/performed Arousal/Alertness: Awake/alert Orientation Level: Appears intact for tasks assessed Behavior During Session: [facility] for tasks performed    Extremity/Trunk Assessment Right Upper Extremity Assessment RUE ROM/Strength/Tone: [facility] for tasks assessed Left Upper Extremity Assessment LUE ROM/Strength/Tone: [facility] for tasks assessed   Mobility Bed Mobility Bed Mobility: Right Sidelying to Sit;Sitting - Scoot to Edge of Bed;Sit to Sidelying Right Right Sidelying to Sit: 5: Supervision;With rails;HOB flat Sitting - Scoot to Edge of Bed: 5: Supervision Sit to Sidelying Right: 5: Supervision;With rail;HOB flat Details for Bed Mobility Assistance: demonstrates good technique Transfers Transfers: Sit to Stand;Stand to Sit Sit to Stand: 5: Supervision Stand to Sit: 5: Supervision   Exercise    Balance    End of Session OT - End of Session Equipment  Utilized During Treatment: Back brace Activity Tolerance: Patient tolerated treatment well Patient left: in bed;with call bell/phone within reach  GO     Eryck Negron M 08/26/2011, 4:33 PM

## 2011-08-26 NOTE — Progress Notes (Signed)
RN notified of abnormal BP

## 2011-08-26 NOTE — Evaluation (Signed)
I have read and agree with the below assessment and treatment plan.  Eleah Lahaie Helen Whitlow PT, DPT Pager: [phone] 

## 2011-08-26 NOTE — Progress Notes (Signed)
Patient ID: Megan Ochoa, female   DOB: 1965-08-02, 46 y.o.   MRN: [id number] Subjective:  The patient is alert and pleasant. She looks well. Her back is appropriate a sore.  Objective: Vital signs in last 24 hours: Temp:  [97.2 F (36.2 C)-97.8 F (36.6 C)] 97.6 F (36.4 C) (07/30 0559) Pulse Rate:  [63-141] 63  (07/30 0559) Resp:  [10-31] 18  (07/30 0559) BP: (100-154)/(55-124) 114/64 mmHg (07/30 0559) SpO2:  [92 %-100 %] 100 % (07/30 0559) Weight:  [107.5 kg (236 lb 15.9 oz)] 107.5 kg (236 lb 15.9 oz) (07/29 1400)  Intake/Output from previous day: 07/29 0701 - 07/30 0700 In: 2200 [I.V.:2200] Out: 673 [Urine:670; Emesis/NG output:3] Intake/Output this shift:    Physical exam the patient is alert and oriented x3. Her strength is normal in her lower extremities.  Lab Results:  [facility] 08/26/11 0520  WBC 16.0*  HGB 13.5  HCT 40.3  PLT 231   BMET  Basename 08/26/11 0520  NA 134*  K 3.7  CL 94*  CO2 32  GLUCOSE 97  BUN 8  CREATININE 0.81  CALCIUM 9.2    Studies/Results: Dg Lumbar Spine 2-3 Views  08/25/2011  *RADIOLOGY REPORT*  Clinical Data: Lower lumbar fusion  DG C-ARM 1-60 MIN,LUMBAR SPINE - 2-3 VIEW  Technique: Spot fluoroscopic intraoperative views,  Comparison:  07/09/2011  Findings: Three spot fluoroscopic intraoperative views were obtained during L4, L5 and L5 S1 posterior fusion and discectomy. Disc spacers present at these two levels.  Normal alignment.  IMPRESSION: Expected appearance during L4-5 and L5 S1 posterior fusion.  Original Report Authenticated By: Judie Petit. Ruel Favors, M.D.   Dg Lumbar Spine 1 View  08/25/2011  *RADIOLOGY REPORT*  Clinical Data: Lumbar fusion  LUMBAR SPINE - 1 VIEW  Comparison: 07/09/2011  Findings: A surgical asthma projects over the L3 inferior articular facet.  Lap sponge in the operative bed.  IMPRESSION: Intraoperative marking at L3.  Original Report Authenticated By: Donavan Burnet, M.D.   Dg C-arm 1-60 Min  08/25/2011   *RADIOLOGY REPORT*  Clinical Data: Lower lumbar fusion  DG C-ARM 1-60 MIN,LUMBAR SPINE - 2-3 VIEW  Technique: Spot fluoroscopic intraoperative views,  Comparison:  07/09/2011  Findings: Three spot fluoroscopic intraoperative views were obtained during L4, L5 and L5 S1 posterior fusion and discectomy. Disc spacers present at these two levels.  Normal alignment.  IMPRESSION: Expected appearance during L4-5 and L5 S1 posterior fusion.  Original Report Authenticated By: Judie Petit. Ruel Favors, M.D.    Assessment/Plan: Postop day #1: The patient is doing well. We will mobilize her with PT and OT. She will likely go home in a couple days.  LOS: 1 day     Javan Gonzaga D 08/26/2011, 7:53 AM
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
## 2011-08-26 NOTE — Progress Notes (Signed)
Small amount of blood noted on patient's bedpad. Dressing reinforced with abd pad. Pt under no s/s of distress. Will contine to monitor.

## 2011-08-26 NOTE — Clinical Social Work Note (Signed)
CSW received consult for SNF. Discussed pt during progression with RN. PT is recommending outpatient PT. Per MD note, pt will discharge home when stable. RNCM is aware and following. CSW is signing off as no further needs identified. Please reconsult if a need arises prior to discharge.   Dede Query, MSW, Theresia Majors [phone]

## 2011-08-26 NOTE — Progress Notes (Signed)
Pt requested foley to be removed this evening. Foley removed. Patient has voided since. Pt has ambulated with minimal assistance and minimal pain to the bathroom several times this shift.

## 2011-08-27 MED ORDER — MORPHINE SULFATE 2 MG/ML IJ SOLN: 2.0000 mg | INTRAMUSCULAR | Status: DC | PRN

## 2011-08-27 MED ORDER — POLYETHYLENE GLYCOL 3350 17 G PO PACK
17.0000 g | PACK | Freq: Every day | ORAL | Status: DC | PRN
  Filled 2011-08-27: qty 1

## 2011-08-27 MED ORDER — POLYETHYLENE GLYCOL 3350 17 G PO PACK
17.0000 g | PACK | Freq: Every day | ORAL | Status: DC
  Administered 2011-08-27 – 2011-08-28 (×2): 17 g via ORAL
  Filled 2011-08-27 (×2): qty 1

## 2011-08-27 MED FILL — Heparin Sodium (Porcine) Inj 1000 Unit/ML: INTRAMUSCULAR | Qty: 30 | Status: AC

## 2011-08-27 MED FILL — Sodium Chloride IV Soln 0.9%: INTRAVENOUS | Qty: 1000 | Status: AC

## 2011-08-27 NOTE — Progress Notes (Signed)
Patient ID: Megan Ochoa, female   DOB: 04-Aug-1965, 46 y.o.   MRN: [id number] Subjective:  The patient is alert and pleasant. Her back is appropriately sore.  Objective: Vital signs in last 24 hours: Temp:  [98 F (36.7 C)-98.7 F (37.1 C)] 98.3 F (36.8 C) (07/31 0545) Pulse Rate:  [77-92] 82  (07/31 0139) Resp:  [13-19] 16  (07/31 0545) BP: (95-113)/(36-76) 113/60 mmHg (07/31 0545) SpO2:  [95 %-100 %] 95 % (07/31 0545)  Intake/Output from previous day: 07/30 0701 - 07/31 0700 In: 1798.9 [P.O.:240; I.V.:1552.9; IV Piggyback:6] Out: -  Intake/Output this shift:    Physical exam the patient is alert and oriented. Her strength is normal in her lower extremity.  Lab Results:  [facility] 08/26/11 0520  WBC 16.0*  HGB 13.5  HCT 40.3  PLT 231   BMET  Basename 08/26/11 0520  NA 134*  K 3.7  CL 94*  CO2 32  GLUCOSE 97  BUN 8  CREATININE 0.81  CALCIUM 9.2    Studies/Results: Dg Lumbar Spine 2-3 Views  08/25/2011  *RADIOLOGY REPORT*  Clinical Data: Lower lumbar fusion  DG C-ARM 1-60 MIN,LUMBAR SPINE - 2-3 VIEW  Technique: Spot fluoroscopic intraoperative views,  Comparison:  07/09/2011  Findings: Three spot fluoroscopic intraoperative views were obtained during L4, L5 and L5 S1 posterior fusion and discectomy. Disc spacers present at these two levels.  Normal alignment.  IMPRESSION: Expected appearance during L4-5 and L5 S1 posterior fusion.  Original Report Authenticated By: Judie Petit. Ruel Favors, M.D.   Dg C-arm 1-60 Min  08/25/2011  *RADIOLOGY REPORT*  Clinical Data: Lower lumbar fusion  DG C-ARM 1-60 MIN,LUMBAR SPINE - 2-3 VIEW  Technique: Spot fluoroscopic intraoperative views,  Comparison:  07/09/2011  Findings: Three spot fluoroscopic intraoperative views were obtained during L4, L5 and L5 S1 posterior fusion and discectomy. Disc spacers present at these two levels.  Normal alignment.  IMPRESSION: Expected appearance during L4-5 and L5 S1 posterior fusion.  Original Report  Authenticated By: Judie Petit. Ruel Favors, M.D.    Assessment/Plan: Postop day #2: The patient is doing well. We will tentatively plan to send her home tomorrow. I will DC the PCA today.  LOS: 2 days     Sotero Brinkmeyer D 08/27/2011, 9:59 AM

## 2011-08-27 NOTE — Progress Notes (Signed)
Physical Therapy Treatment Patient Details Name: Megan Ochoa MRN: [id number] DOB: Jan 07, 1966 Today's Date: 08/27/2011 Time: 4098-1191 PT Time Calculation (min): 23 min  PT Assessment / Plan / Recommendation Comments on Treatment Session  Pt continues to improve with mobility but still requires some cueing to maintain back precautions during mobility. Pt expressed concern about being able to get into bed at home due to bed being high and was told that PT would make sure to see pt before discharge to work on bed mobility.    Follow Up Recommendations  No PT follow up;Supervision for mobility/OOB    Barriers to Discharge        Equipment Recommendations  Rolling walker with 5" wheels    Recommendations for Other Services    Frequency Min 5X/week   Plan Discharge plan needs to be updated    Precautions / Restrictions Precautions Precautions: Back Precaution Comments: Pt able to verbalize 2/3 back precautions, 3/3 with cues. Pt encouraged to review back precautions hand out. Required Braces or Orthoses: Spinal Brace Spinal Brace: Lumbar corset Restrictions Weight Bearing Restrictions: No       Mobility  Bed Mobility Details for Bed Mobility Assistance: Pt standing in bathroom upon presentation. Transfers Transfers: Stand to Sit;Sit to Stand Sit to Stand: 5: Supervision Stand to Sit: 5: Supervision Details for Transfer Assistance: Pt required supervision during transfers with verbal cueing to maintain back precautions during mobility. Ambulation/Gait Ambulation/Gait Assistance: 5: Supervision Ambulation Distance (Feet): 400 Feet Assistive device: Rolling walker Ambulation/Gait Assistance Details: Pt required supervision for safety and verbal cueing to maintain upright posture and remaining in the walker base while ambulating. Gait Pattern: Step-through pattern;Decreased stride length;Decreased trunk rotation Stairs: Yes Stairs Assistance: 4: Min guard Stair Management  Technique: One rail Right;Step to pattern Number of Stairs: 4      PT Goals Acute Rehab PT Goals PT Goal: Sit to Stand - Progress: Progressing toward goal PT Goal: Stand to Sit - Progress: Progressing toward goal PT Goal: Ambulate - Progress: Progressing toward goal PT Goal: Up/Down Stairs - Progress: Progressing toward goal  Visit Information  Last PT Received On: 08/27/11 Assistance Needed: +1    Subjective Data  Subjective: "I am ready to get out of here."   Cognition  Overall Cognitive Status: Appears within functional limits for tasks assessed/performed Arousal/Alertness: Awake/alert Orientation Level: Appears intact for tasks assessed Behavior During Session: [facility] for tasks performed    Balance     End of Session PT - End of Session Equipment Utilized During Treatment: Gait belt;Back brace Activity Tolerance: Patient tolerated treatment well Patient left: in chair;with call bell/phone within reach;with family/visitor present Nurse Communication: Mobility status;Patient requests pain meds   GP     Virginie Josten 08/27/2011, 12:41 PM

## 2011-08-27 NOTE — Progress Notes (Signed)
Occupational Therapy Treatment Patient Details Name: Megan Ochoa MRN: [id number] DOB: [birth year] Today's Date: 08/27/2011 Time: 6578-4696 OT Time Calculation (min): 15 min  OT Assessment / Plan / Recommendation Comments on Treatment Session Pt. progeressing well.  Demonstrates good understanding of back precautions.    Follow Up Recommendations  No OT follow up;Supervision - Intermittent    Barriers to Discharge       Equipment Recommendations  Rolling walker with 5" wheels;3 in 1 bedside comode    Recommendations for Other Services    Frequency Min 2X/week   Plan Discharge plan remains appropriate    Precautions / Restrictions Precautions Precautions: Back Precaution Booklet Issued: Yes (comment) Precaution Comments: Pt. able to verbalize 3/3 back precuations and idependently dons brace Required Braces or Orthoses: Spinal Brace Spinal Brace: Lumbar corset Restrictions Weight Bearing Restrictions: No   Pertinent Vitals/Pain     ADL  Upper Body Bathing: Simulated;Supervision/safety Where Assessed - Upper Body Bathing: Supported standing Lower Body Bathing: Simulated;Supervision/safety Where Assessed - Lower Body Bathing: Unsupported sit to stand Upper Body Dressing: Simulated;Set up Where Assessed - Upper Body Dressing: Unsupported sitting Lower Body Dressing: Simulated;Set up Where Assessed - Lower Body Dressing: Supported sit to stand Toilet Transfer: Research scientist (life sciences) Method: Sit to Barista: Comfort height toilet Toileting - Architect and Hygiene: Performed;Modified independent Where Assessed - Toileting Clothing Manipulation and Hygiene: Standing Tub/Shower Transfer: Landscape architect Method: Science writer: Walk in shower Equipment Used: Back brace;Rolling walker Transfers/Ambulation Related to ADLs: supervision ADL Comments: Pt. able to  teach back appropriate method for LB ADLs.  Pt. able to move sit to stand from comfort height commode to simulate transfer to built in shower seat.  SHe has standard toilet at home and therefore will need 3-in-1 at home.  pt. demonstrates good understanding of back precautions    OT Diagnosis:    OT Problem List:   OT Treatment Interventions:     OT Goals Acute Rehab OT Goals Potential to Achieve Goals: Good ADL Goals ADL Goal: Upper Body Bathing - Progress: Progressing toward goals ADL Goal: Lower Body Bathing - Progress: Progressing toward goals ADL Goal: Upper Body Dressing - Progress: Progressing toward goals ADL Goal: Lower Body Dressing - Progress: Progressing toward goals ADL Goal: Toilet Transfer - Progress: Progressing toward goals ADL Goal: Toileting - Hygiene - Progress: Met ADL Goal: Tub/Shower Transfer - Progress: Met  Visit Information  Last OT Received On: 08/27/11 Assistance Needed: +1    Subjective Data      Prior Functioning       Cognition  Overall Cognitive Status: Appears within functional limits for tasks assessed/performed Arousal/Alertness: Awake/alert Orientation Level: Appears intact for tasks assessed Behavior During Session: [facility] for tasks performed    Mobility Bed Mobility Bed Mobility: Right Sidelying to Sit;Sitting - Scoot to Edge of Bed;Sit to Sidelying Right Right Sidelying to Sit: 6: Modified independent (Device/Increase time);HOB flat Sitting - Scoot to Edge of Bed: 6: Modified independent (Device/Increase time) Sit to Sidelying Right: 6: Modified independent (Device/Increase time);HOB flat Transfers Transfers: Sit to Stand;Stand to Sit Sit to Stand: 5: Supervision Stand to Sit: 5: Supervision   Exercises    Balance    End of Session OT - End of Session Equipment Utilized During Treatment: Back brace Activity Tolerance: Patient tolerated treatment well Patient left: in bed;with call bell/phone within reach;with family/visitor present    GO     Artasia Thang, Ursula Alert M 08/27/2011, 6:26 PM

## 2011-08-27 NOTE — Progress Notes (Signed)
I have read and agree with the below treatment note and plan. Helvi Royals Helen Whitlow PT, DPT Pager: [phone] 

## 2011-08-28 MED ORDER — OXYCODONE-ACETAMINOPHEN 10-325 MG PO TABS: 1.0000 | ORAL_TABLET | ORAL | Status: AC | PRN

## 2011-08-28 MED ORDER — DSS 100 MG PO CAPS: 100.0000 mg | ORAL_CAPSULE | Freq: Two times a day (BID) | ORAL | Status: AC

## 2011-08-28 MED ORDER — DIAZEPAM 5 MG PO TABS: 5.0000 mg | ORAL_TABLET | Freq: Four times a day (QID) | ORAL | Status: AC | PRN

## 2011-08-28 NOTE — Progress Notes (Signed)
Pt dc instructions provided. Pt verbalized understanding. Prescriptions given to pt. Pt iv d/c intact. Pt under no  S/s distress.

## 2011-08-28 NOTE — Discharge Summary (Signed)
Physician Discharge Summary  Patient ID: Megan Ochoa MRN: [id number] DOB/AGE: 46-Dec-1967 45 y.o.  Admit date: 08/25/2011 Discharge date: 08/28/2011  Admission Diagnoses: L4-5 and L5-S1 disc degeneration, lumbago, lumbar radiculopathy, lumbar stenosis.  Discharge Diagnoses: The same Principal Problem:  *Degeneration of lumbar intervertebral disc   Discharged Condition: good  Hospital Course: I admitted the patient to [facility] on 7/29 13. On that day I performed an L4-5 and L5-S1 decompression, each patient, and fusion. The surgery were well.  The patient's postop course was unremarkable and she was discharged home on a one 69. The patient was given oral and written discharge instructions. All her questions were answered.  Consults: None Significant Diagnostic Studies: None Treatments: L4-5 and L5-S1 redo laminectomy, decompression, each patient, and fusion. Discharge Exam: Blood pressure 133/86, pulse 75, temperature 98 F (36.7 C), temperature source Oral, resp. rate 20, height 5\' 7"  (1.702 m), weight 107.5 kg (236 lb 15.9 oz), SpO2 98.00%. The patient is alert and pleasant. Her strength is normal. Her wound is healing well without signs of infection.  Disposition: Home  Discharge Orders    Future Orders Please Complete By Expires   Diet - low sodium heart healthy      Increase activity slowly      Discharge instructions      Comments:   Call [phone] for a followup appointment.   No dressing needed      Call MD for:  temperature >100.4      Call MD for:  persistant nausea and vomiting      Call MD for:  severe uncontrolled pain      Call MD for:  redness, tenderness, or signs of infection (pain, swelling, redness, odor or green/yellow discharge around incision site)      Call MD for:  difficulty breathing, headache or visual disturbances      Call MD for:  hives      Call MD for:  persistant dizziness or light-headedness      Call MD for:  extreme  fatigue        Medication List  As of 08/28/2011  1:51 PM   STOP taking these medications         HYDROcodone-acetaminophen 10-325 MG per tablet      ibuprofen 200 MG tablet      meloxicam 15 MG tablet      tiZANidine 4 MG capsule         TAKE these medications         albuterol 108 (90 BASE) MCG/ACT inhaler   Commonly known as: PROVENTIL HFA;VENTOLIN HFA   Inhale 2 puffs into the lungs every 6 (six) hours as needed. For shortness of breath      diazepam 5 MG tablet   Commonly known as: VALIUM   Take 1 tablet (5 mg total) by mouth every 6 (six) hours as needed.      DSS 100 MG Caps   Take 100 mg by mouth 2 (two) times daily.      estradiol 1 MG tablet   Commonly known as: ESTRACE   Take 1.5 mg by mouth daily.      fluconazole 50 MG tablet   Commonly known as: DIFLUCAN   Take 50 mg by mouth daily as needed.      omeprazole 20 MG capsule   Commonly known as: PRILOSEC   Take 20 mg by mouth daily.      oxyCODONE-acetaminophen 10-325 MG per tablet   Commonly  known as: PERCOCET   Take 1 tablet by mouth every 4 (four) hours as needed for pain.      promethazine 25 MG tablet   Commonly known as: PHENERGAN   Take 25 mg by mouth every 6 (six) hours as needed. For nausea      traZODone 50 MG tablet   Commonly known as: DESYREL   Take 50 mg by mouth at bedtime.      valsartan-hydrochlorothiazide 320-25 MG per tablet   Commonly known as: DIOVAN-HCT   Take 1 tablet by mouth daily.             SignedTressie Stalker D 08/28/2011, 1:51 PM

## 2011-08-28 NOTE — Progress Notes (Signed)
Physical Therapy Treatment Patient Details Name: Megan Ochoa MRN: [id number] DOB: 24-May-1965 Today's Date: 08/28/2011 Time: 0807-0820 PT Time Calculation (min): 13 min  PT Assessment / Plan / Recommendation Comments on Treatment Session  Planning on going home today. After practicing bed mobility today she feels confident about going home with support from her family. Educated pt on option for outpatient PT for improved strengthening once cleared by surgeon if she feels she is still having difficulties. Pt verbalizes understanding. Son and daughter will provide assist at home.     Follow Up Recommendations  No PT follow up    Barriers to Discharge        Equipment Recommendations  Rolling walker with 5" wheels;3 in 1 bedside comode    Recommendations for Other Services    Frequency     Plan Discharge plan remains appropriate;Frequency remains appropriate    Precautions / Restrictions Precautions Precautions: Back Precaution Comments: Pt independently correcting herself duing mobility and verbalizes 3/3 back precautions       Mobility  Bed Mobility Details for Bed Mobility Assistance: Elevated the height of her bed to simulate her personal bed from home, pt able to step up onto step stool backwards in prep to sit on bed with minimal verbal cues to avoid twisting Transfers Sit to Stand: 6: Modified independent (Device/Increase time);With upper extremity assist;From chair/3-in-1;From bed Stand to Sit: 6: Modified independent (Device/Increase time);With upper extremity assist;To chair/3-in-1;To bed Ambulation/Gait Ambulation/Gait Assistance: 6: Modified independent (Device/Increase time) Ambulation Distance (Feet): 400 Feet Assistive device: Rolling walker Ambulation/Gait Assistance Details: appropriately slowed gait because of stiffness Stairs Assistance: 5: Supervision Stairs Assistance Details (indicate cue type and reason): initially utilizing 2 rails with verbal cues for  sequencing, her second attempt pt utlized one rail to simulate home environment still with supervision, no major difficulties Stair Management Technique: Two rails;One rail Right Number of Stairs: 10     Exercises        PT Goals Acute Rehab PT Goals PT Goal: Sit to Stand - Progress: Met PT Goal: Stand to Sit - Progress: Met PT Goal: Ambulate - Progress: Met PT Goal: Up/Down Stairs - Progress: Met  Visit Information  Last PT Received On: 08/28/11 Assistance Needed: +1    Subjective Data  Subjective: I've already been up walking but I'll go again.    Cognition  Overall Cognitive Status: Appears within functional limits for tasks assessed/performed Arousal/Alertness: Awake/alert Orientation Level: Appears intact for tasks assessed Behavior During Session: [facility] for tasks performed    Balance     End of Session PT - End of Session Equipment Utilized During Treatment: Gait belt;Back brace Activity Tolerance: Patient tolerated treatment well Patient left: in chair;with call bell/phone within reach   GP     [facility] HELEN 08/28/2011, 8:28 AM

## 2011-08-29 NOTE — Care Management Note (Signed)
    Page 1 of 1   08/29/2011     12:12:52 PM   CARE MANAGEMENT NOTE 08/29/2011  Patient:  Megan Ochoa, Megan Ochoa   Account Number:  [id number]  Date Initiated:  08/29/2011  Documentation initiated by:  Onnie Boer  Subjective/Objective Assessment:   PT WAS ADMITTED FOR BACK SURGERY     Action/Plan:   PROGRESSION OF CARE AND DISCHARGE PLANNING   Anticipated DC Date:  08/28/2011   Anticipated DC Plan:  HOME/SELF CARE         Choice offered to / List presented to:  C-1 Patient   DME arranged  3-N-1  Levan Hurst      DME agency  [facility]        Status of service:  Completed, signed off Medicare Important Message given?   (If response is "NO", the following Medicare IM given date fields will be blank) Date Medicare IM given:   Date Additional Medicare IM given:    Discharge Disposition:  HOME/SELF CARE  Per UR Regulation:  Reviewed for med. necessity/level of care/duration of stay  If discussed at Long Length of Stay Meetings, dates discussed:    Comments:  08/29/11 Onnie Boer, RN, BSN 1211 PT WAS ADMITTED FOR A PLIF REDO.  PT WAS DC'D TO HOME WITH THE ABOVE DME'S FROM [facility].  PT RECOMMENDED OP PT, NO ORDERS RECEIVED

## 2012-07-08 ENCOUNTER — Emergency Department (HOSPITAL_COMMUNITY)
Admission: EM | Admit: 2012-07-08 | Discharge: 2012-07-08 | Disposition: A | Discharge status: Home / Self Care | Payer: Commercial Managed Care - PPO | Attending: Emergency Medicine | Admitting: Emergency Medicine

## 2012-07-08 ENCOUNTER — Encounter (HOSPITAL_COMMUNITY): Payer: Self-pay

## 2012-07-08 ENCOUNTER — Emergency Department (HOSPITAL_COMMUNITY): Payer: Commercial Managed Care - PPO

## 2012-07-08 DIAGNOSIS — J45909 Unspecified asthma, uncomplicated: Secondary | ICD-10-CM | POA: Insufficient documentation

## 2012-07-08 DIAGNOSIS — Z79899 Other long term (current) drug therapy: Secondary | ICD-10-CM | POA: Insufficient documentation

## 2012-07-08 DIAGNOSIS — Z8739 Personal history of other diseases of the musculoskeletal system and connective tissue: Secondary | ICD-10-CM | POA: Insufficient documentation

## 2012-07-08 DIAGNOSIS — G40909 Epilepsy, unspecified, not intractable, without status epilepticus: Secondary | ICD-10-CM | POA: Insufficient documentation

## 2012-07-08 DIAGNOSIS — R259 Unspecified abnormal involuntary movements: Secondary | ICD-10-CM | POA: Insufficient documentation

## 2012-07-08 DIAGNOSIS — G510 Bell's palsy: Secondary | ICD-10-CM | POA: Insufficient documentation

## 2012-07-08 DIAGNOSIS — K219 Gastro-esophageal reflux disease without esophagitis: Secondary | ICD-10-CM | POA: Insufficient documentation

## 2012-07-08 DIAGNOSIS — R079 Chest pain, unspecified: Secondary | ICD-10-CM | POA: Insufficient documentation

## 2012-07-08 DIAGNOSIS — F17200 Nicotine dependence, unspecified, uncomplicated: Secondary | ICD-10-CM | POA: Insufficient documentation

## 2012-07-08 DIAGNOSIS — M2550 Pain in unspecified joint: Secondary | ICD-10-CM | POA: Insufficient documentation

## 2012-07-08 DIAGNOSIS — I10 Essential (primary) hypertension: Secondary | ICD-10-CM | POA: Insufficient documentation

## 2012-07-08 DIAGNOSIS — R42 Dizziness and giddiness: Secondary | ICD-10-CM | POA: Insufficient documentation

## 2012-07-08 LAB — URINALYSIS, ROUTINE W REFLEX MICROSCOPIC
Glucose, UA: NEGATIVE mg/dL
Hgb urine dipstick: NEGATIVE
Ketones, ur: NEGATIVE mg/dL
Protein, ur: NEGATIVE mg/dL
Urobilinogen, UA: 0.2 mg/dL (ref 0.0–1.0)

## 2012-07-08 LAB — COMPREHENSIVE METABOLIC PANEL
AST: 92 U/L — ABNORMAL HIGH (ref 0–37)
Albumin: 3.3 g/dL — ABNORMAL LOW (ref 3.5–5.2)
Alkaline Phosphatase: 80 U/L (ref 39–117)
BUN: 9 mg/dL (ref 6–23)
CO2: 30 mEq/L (ref 19–32)
Chloride: 96 mEq/L (ref 96–112)
Creatinine, Ser: 0.93 mg/dL (ref 0.50–1.10)
GFR calc non Af Amer: 73 mL/min — ABNORMAL LOW (ref >90)
Potassium: 4.4 mEq/L (ref 3.5–5.1)
Total Bilirubin: 0.9 mg/dL (ref 0.3–1.2)

## 2012-07-08 LAB — CBC WITH DIFFERENTIAL/PLATELET
HCT: 40.7 % (ref 36.0–46.0)
Hemoglobin: 13.8 g/dL (ref 12.0–15.0)
Lymphocytes Relative: 21 % (ref 12–46)
Monocytes Absolute: 0.9 10*3/uL (ref 0.1–1.0)
Monocytes Relative: 8 % (ref 3–12)
Neutro Abs: 7.4 10*3/uL (ref 1.7–7.7)
Neutrophils Relative %: 70 % (ref 43–77)
RBC: 4.65 MIL/uL (ref 3.87–5.11)
WBC: 10.7 10*3/uL — ABNORMAL HIGH (ref 4.0–10.5)

## 2012-07-08 NOTE — ED Provider Notes (Signed)
History     CSN: [id number]  Arrival date & time 07/08/12  1449   First MD Initiated Contact with Patient 07/08/12 1718      No chief complaint on file.   (Consider location/radiation/quality/duration/timing/severity/associated sxs/prior treatment) Patient is a 47 y.o. female presenting with neurologic complaint. The history is provided by the patient.  Neurologic Problem This is a new problem. The current episode started 1 to 4 weeks ago (19 days ago). The problem occurs constantly. The problem has been unchanged. Associated symptoms include arthralgias (hips, knees) and chest pain (intermittent for past 19 days). Pertinent negatives include no abdominal pain, chills, congestion, coughing, fever, headaches, nausea, numbness, sore throat, visual change, vomiting or weakness. Nothing aggravates the symptoms. Treatments tried: antivirals, prednisone taper. The treatment provided no relief.    Past Medical History  Diagnosis Date  . Hypertension   . Asthma   . Seizures     none since age 63,not on medication  . Arthritis   . GERD (gastroesophageal reflux disease)   . Urinary frequency   . Nocturia   . Joint pain   . Insomnia   . Neck pain, bilateral   . Dizziness   . PONV (postoperative nausea and vomiting)     woke up during a colonoscopy done under sedation    Past Surgical History  Procedure Laterality Date  . Appendectomy    . Tonsillectomy    . Carpal tunnel release    . Cesarean section    . Abdominal hysterectomy    . Cholecystectomy    . Lymph node biopsy    . Back surgery    . Right arm bone fusion      History reviewed. No pertinent family history.  History  Substance Use Topics  . Smoking status: Current Every Day Smoker -- 1.00 packs/day for 20 years    Types: Cigarettes  . Smokeless tobacco: Not on file  . Alcohol Use: No    OB History   Grav Para Term Preterm Abortions TAB SAB Ect Mult Living                  Review of Systems   Constitutional: Negative for fever and chills.  HENT: Negative for congestion, sore throat and rhinorrhea.   Eyes: Negative for pain, redness and visual disturbance.  Respiratory: Negative for cough, chest tightness and shortness of breath.   Cardiovascular: Positive for chest pain (intermittent for past 19 days). Negative for palpitations.  Gastrointestinal: Negative for nausea, vomiting, abdominal pain, diarrhea, constipation and rectal pain.  Genitourinary: Negative for dysuria and difficulty urinating.  Musculoskeletal: Positive for arthralgias (hips, knees). Negative for back pain.  Neurological: Positive for dizziness, tremors (both hands) and light-headedness. Negative for weakness, numbness and headaches.  All other systems reviewed and are negative.    Allergies  Bupropion; Codeine; Detrol; Etodolac; Metoprolol succinate; and Tizanidine  Home Medications   Current Outpatient Rx  Name  Route  Sig  Dispense  Refill  . albuterol-ipratropium (COMBIVENT) 18-103 MCG/ACT inhaler   Inhalation   Inhale 2 puffs into the lungs every 6 (six) hours as needed for wheezing.         . calcium carbonate (TUMS - DOSED IN MG ELEMENTAL CALCIUM) 500 MG chewable tablet   Oral   Chew 2 tablets by mouth daily as needed for heartburn.         Marland Kitchen CALCIUM MAGNESIUM 750 PO   Oral   Take 1 tablet by mouth at bedtime.         Marland Kitchen  diazepam (VALIUM) 5 MG tablet   Oral   Take 5 mg by mouth every 8 (eight) hours as needed for anxiety.         . DULoxetine (CYMBALTA) 60 MG capsule   Oral   Take 60 mg by mouth daily.         Marland Kitchen estradiol (ESTRACE) 1 MG tablet   Oral   Take 1.5 mg by mouth daily.         . fluticasone (FLONASE) 50 MCG/ACT nasal spray   Nasal   Place 1 spray into the nose 2 (two) times daily as needed for rhinitis.         Marland Kitchen guaiFENesin-dextromethorphan (ROBITUSSIN DM) 100-10 MG/5ML syrup   Oral   Take 5 mLs by mouth 3 (three) times daily as needed for cough.          Marland Kitchen HYDROcodone-acetaminophen (NORCO) 10-325 MG per tablet   Oral   Take 1 tablet by mouth every 5 (five) hours as needed for pain.         . hydrocortisone cream 1 %   Topical   Apply 1 application topically 2 (two) times daily as needed (to rash).         Marland Kitchen omeprazole (PRILOSEC) 20 MG capsule   Oral   Take 20 mg by mouth daily.         . promethazine (PHENERGAN) 25 MG tablet   Oral   Take 25 mg by mouth every 6 (six) hours as needed. For nausea         . traZODone (DESYREL) 50 MG tablet   Oral   Take 100 mg by mouth at bedtime.          . valsartan-hydrochlorothiazide (DIOVAN-HCT) 320-25 MG per tablet   Oral   Take 1 tablet by mouth daily.           BP 115/67  Pulse 85  Temp(Src) 98.4 F (36.9 C) (Oral)  Resp 18  SpO2 97%  Physical Exam  Nursing note and vitals reviewed. Constitutional: She is oriented to person, place, and time. She appears well-developed and well-nourished. No distress.  HENT:  Head: Normocephalic and atraumatic.  Mouth/Throat: Oropharynx is clear and moist.  Eyes: EOM are normal. Pupils are equal, round, and reactive to light.  Neck: Normal range of motion. Neck supple.  Cardiovascular: Normal rate, regular rhythm and normal heart sounds.  Exam reveals no friction rub.   No murmur heard. Pulmonary/Chest: Effort normal and breath sounds normal. No respiratory distress. She has no wheezes. She has no rales.  Abdominal: Soft. There is no tenderness. There is no rebound and no guarding.  Musculoskeletal: Normal range of motion. She exhibits no edema and no tenderness.  Lymphadenopathy:    She has no cervical adenopathy.  Neurological: She is alert and oriented to person, place, and time.  Right sided facial weakness noted. Unable to fully close right eye. Weakness of smile on right. 5/5 strength in all extremities.   Skin: Skin is warm and dry. No rash noted.  Psychiatric: She has a normal mood and affect. Her behavior is normal.     ED Course  Procedures (including critical care time)  Labs Reviewed  CBC WITH DIFFERENTIAL - Abnormal; Notable for the following:    WBC 10.7 (*)    All other components within normal limits  COMPREHENSIVE METABOLIC PANEL - Abnormal; Notable for the following:    Sodium 133 (*)    Albumin 3.3 (*)  AST 92 (*)    ALT 108 (*)    GFR calc non Af Amer 73 (*)    GFR calc Af Amer 84 (*)    All other components within normal limits  URINALYSIS, ROUTINE W REFLEX MICROSCOPIC - Abnormal; Notable for the following:    APPearance HAZY (*)    All other components within normal limits   Dg Chest 2 View  07/08/2012   *RADIOLOGY REPORT*  Clinical Data: Right facial paralysis.  Weakness.  CHEST - 2 VIEW  Comparison: 08/20/2011.  Findings: Normal sized heart.  Clear lungs.  The interstitial markings remain mildly prominent.  Unremarkable bones. Cholecystectomy clips.  IMPRESSION: Stable mild chronic interstitial lung disease.  No acute abnormality.   Original Report Authenticated By: Beckie Salts, M.D.    Date: 07/09/2012  Rate: 61  Rhythm: normal sinus rhythm  QRS Axis: normal  Intervals: normal  ST/T Wave abnormalities: normal  Conduction Disutrbances:none  Narrative Interpretation:   Old EKG Reviewed: none available     1. Bell's palsy       MDM  70:2 PM 47 year old female presenting with 19 days of right facial weakness. Patient states she was diagnosed with Bell's palsy. She was treated with antivirals and attend a course of prednisone. She states her symptoms have not improved. She has a very long list of various complaints including intermittent chest pain, swelling in both feet, joint pain and hand tremors. Patient noted to have weakness of her upper and lower right face on exam consistent with her history of Bell's palsy. Labs show no significant abnormality. Mild leukoctyosis, however improved from priors. Overall this is c/w bells palsy that has not responded to steroid  therapy. Do not feel this is stroke as she has no risk factors and involves upper and lower facial nerves. She reports multiple episodes of bells palsy and it was explained that given this she can be at increased risk for repeat episodes. It was explained that steroids do not work 100% of the time. She will f/u with her PCP for re-evaluation. She voiced understanding and dc'd home in stable condition.         Caren Hazy, MD 07/09/12 0107  Caren Hazy, MD 07/09/12 [phone]

## 2012-07-08 NOTE — ED Notes (Signed)
Pt presents with multiple complaints.  Pt reports 19 day h/o R sided facial paralysis - pt diagnosed with Bell's Palsy.  Pt reports increased fatigue, R sided neck pain, dizziness, nausea, swelling in L leg and foot, blurred vision in both eyes and bilateral hand tremors. Pt has been seen by PA for all of the above complaints, was placed on steroids and antivirals.  Pt reports her blood pressure has been low since last night.

## 2012-07-08 NOTE — ED Provider Notes (Signed)
I saw and evaluated the patient, reviewed the resident's note and I agree with the findings and plan. Agree with EKG interpretation if present.   Pt with multiple complaints, general weakness. Normal workup here. Hemodynamically stable. Advised close PCP followup for evaluation of her other complaints.   Charles B. Bernette Mayers, MD 07/08/12 2314

## 2012-07-08 NOTE — ED Notes (Signed)
Pt c/o sudden sharp pain under her left breast.

## 2012-07-08 NOTE — ED Notes (Signed)
Pt states understanding of discharge instructions 

## 2012-12-16 ENCOUNTER — Other Ambulatory Visit: Payer: Self-pay | Admitting: Neurosurgery

## 2012-12-16 DIAGNOSIS — M549 Dorsalgia, unspecified: Secondary | ICD-10-CM

## 2012-12-20 ENCOUNTER — Other Ambulatory Visit: Payer: Medicare Other

## 2012-12-27 ENCOUNTER — Ambulatory Visit
Admission: RE | Admit: 2012-12-27 | Discharge: 2012-12-27 | Disposition: A | Discharge status: Home / Self Care | Payer: Commercial Managed Care - PPO | Source: Ambulatory Visit | Attending: Neurosurgery | Admitting: Neurosurgery

## 2012-12-27 VITALS — BP 128/84 | HR 54

## 2012-12-27 DIAGNOSIS — M549 Dorsalgia, unspecified: Secondary | ICD-10-CM

## 2012-12-27 DIAGNOSIS — M5136 Other intervertebral disc degeneration, lumbar region: Secondary | ICD-10-CM

## 2012-12-27 MED ORDER — MEPERIDINE HCL 100 MG/ML IJ SOLN
100.0000 mg | Freq: Once | INTRAMUSCULAR | Status: AC
  Administered 2012-12-27: 100 mg via INTRAMUSCULAR

## 2012-12-27 MED ORDER — ONDANSETRON HCL 8 MG PO TABS
8.0000 mg | ORAL_TABLET | Freq: Once | ORAL | Status: AC
  Administered 2012-12-27: 8 mg via ORAL

## 2012-12-27 MED ORDER — DIAZEPAM 5 MG PO TABS
10.0000 mg | ORAL_TABLET | Freq: Once | ORAL | Status: AC
  Administered 2012-12-27: 10 mg via ORAL

## 2012-12-27 MED ORDER — IOHEXOL 180 MG/ML  SOLN
15.0000 mL | Freq: Once | INTRAMUSCULAR | Status: AC | PRN
  Administered 2012-12-27: 15 mL via INTRATHECAL

## 2012-12-27 NOTE — Progress Notes (Addendum)
Patient states she has been off Wellbutrin, Trazodone, Cymbalta and Promethazine for the past two days.  Patient with complaint of nausea always but worse now that she is off Promethazine for myelogram.  Will medicate with Zofran.  jkl

## 2013-04-15 ENCOUNTER — Encounter (HOSPITAL_COMMUNITY): Payer: Self-pay | Admitting: Emergency Medicine

## 2013-04-15 ENCOUNTER — Emergency Department (HOSPITAL_COMMUNITY)
Admission: EM | Admit: 2013-04-15 | Discharge: 2013-04-15 | Discharge status: Against Medical Advice | Payer: Commercial Managed Care - PPO | Attending: Emergency Medicine | Admitting: Emergency Medicine

## 2013-04-15 ENCOUNTER — Emergency Department (HOSPITAL_COMMUNITY): Payer: Commercial Managed Care - PPO

## 2013-04-15 DIAGNOSIS — Y92410 Unspecified street and highway as the place of occurrence of the external cause: Secondary | ICD-10-CM | POA: Insufficient documentation

## 2013-04-15 DIAGNOSIS — S4980XA Other specified injuries of shoulder and upper arm, unspecified arm, initial encounter: Secondary | ICD-10-CM | POA: Insufficient documentation

## 2013-04-15 DIAGNOSIS — F17200 Nicotine dependence, unspecified, uncomplicated: Secondary | ICD-10-CM | POA: Insufficient documentation

## 2013-04-15 DIAGNOSIS — S59919A Unspecified injury of unspecified forearm, initial encounter: Secondary | ICD-10-CM

## 2013-04-15 DIAGNOSIS — J45909 Unspecified asthma, uncomplicated: Secondary | ICD-10-CM | POA: Insufficient documentation

## 2013-04-15 DIAGNOSIS — I10 Essential (primary) hypertension: Secondary | ICD-10-CM | POA: Insufficient documentation

## 2013-04-15 DIAGNOSIS — S0990XA Unspecified injury of head, initial encounter: Secondary | ICD-10-CM | POA: Insufficient documentation

## 2013-04-15 DIAGNOSIS — S6990XA Unspecified injury of unspecified wrist, hand and finger(s), initial encounter: Secondary | ICD-10-CM | POA: Insufficient documentation

## 2013-04-15 DIAGNOSIS — Y9389 Activity, other specified: Secondary | ICD-10-CM | POA: Insufficient documentation

## 2013-04-15 DIAGNOSIS — S46909A Unspecified injury of unspecified muscle, fascia and tendon at shoulder and upper arm level, unspecified arm, initial encounter: Secondary | ICD-10-CM | POA: Insufficient documentation

## 2013-04-15 DIAGNOSIS — S59909A Unspecified injury of unspecified elbow, initial encounter: Secondary | ICD-10-CM | POA: Insufficient documentation

## 2013-04-15 DIAGNOSIS — IMO0002 Reserved for concepts with insufficient information to code with codable children: Secondary | ICD-10-CM | POA: Insufficient documentation

## 2013-04-15 NOTE — ED Notes (Signed)
Pt came to nurse first asking about wait time, states "all i need is an xray." informed pt of wait times and that her xrays had been ordered.
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
## 2013-04-15 NOTE — ED Notes (Signed)
PT wanting to go home; RN tried to encourage pt to stay; pt states "she is hungry and vending machine muffin expired in 2010 so she was going home" RN apologized and encouraged her to call number on vending machine.

## 2014-07-10 ENCOUNTER — Ambulatory Visit (INDEPENDENT_AMBULATORY_CARE_PROVIDER_SITE_OTHER): Payer: Commercial Managed Care - HMO | Admitting: Neurology

## 2014-07-10 ENCOUNTER — Encounter: Payer: Self-pay | Admitting: Neurology

## 2014-07-10 VITALS — BP 150/97 | HR 91 | Ht 66.5 in | Wt 255.0 lb

## 2014-07-10 DIAGNOSIS — G43009 Migraine without aura, not intractable, without status migrainosus: Secondary | ICD-10-CM | POA: Diagnosis not present

## 2014-07-10 DIAGNOSIS — R569 Unspecified convulsions: Secondary | ICD-10-CM | POA: Diagnosis not present

## 2014-07-10 DIAGNOSIS — R29810 Facial weakness: Secondary | ICD-10-CM

## 2014-07-10 MED ORDER — TOPIRAMATE 100 MG PO TABS: ORAL_TABLET | ORAL | Status: DC

## 2014-07-10 NOTE — Progress Notes (Signed)
PATIENT: Megan Ochoa DOB: 1965/03/22  Chief Complaint  Patient presents with  . Facial spasms    Concerned about right-sided facial spasms.  Reports having Bell's Palsy nine times.  . Headache    Headaches occur intermittently throughout the day. Says the pain is always on the right side.    . Seizure-like activity    Reports episodes brought on by laughing.  She experiences eye fluttering, abnormal head movements and slow verbal responses.    HISTORICAL  Megan Ochoa is a 49 right-handed female, accompanied by her mother-in-law, seen in refer by ENT Dr. Melissa Montane, and her primary care physician Dr. Leanord Asal for evaluation of severe headaches, seizure-like activity, right facial spasm  I have reviewed referring notes from Dr. Melissa Montane, and her primary care doctor Greig Right.  Patient reported a history of seizure disorder, generalized tonic clonic seizure at age 49, recurrent episodes, was treated with Dilantin up to 500 mg every day until age 49,Dilantin was tapered off, after she stopped having seizure-like activity  Right facial weakness, history of recurrent right facial nerve palsy. She had her first right Bell's palsy in 1990s, weakness involving her right upper and lower face, gradually recovered over 6-8 weeks course, but she never achieved total recovery, at baseline, she has mild asymmetry of her face, since the initial episode, she had multiple recurrent episodes of right facial weakness, most recent one was November 2015,   I also reviewed record from [facility],  report of MRI of the neck in Jun 05 2002,  multiple lymph node in V level bilaterally, largest on the left side, superficial palpable lymph note measure approximately 1.0 centimeter, per report. There was no evidence of necrotic center, she had needled biopsy of lymph node "abnormal cells", but there was no treatment offered.  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  She has a history of chronic low back pain, had a history of lumbar decompression, and fusion surgery in the past,  most one was in Nov 2015, she continue complains of low back pain, is taking chronic hydrocodone.  Since 2014, she had recurrent episode of sudden loss of muscle tone,but no loss of consciousness, usually induced by prolonged, hard laughing, her eyes rolled back, difficulty controlling her arm and legs, but no dysarthria, she also complains of excessive daytime, sleepiness, but she denies episodes of sudden sleep attacks, or hallucinations, vivid dreams. This episode is different from her previous history of seizure.  She had a previous occasionally headache, getting worse since October 2015, to almost daily bases, right occipital area, spreading to vortex region, no light noise sensitivity, sometimes nauseous, sleep helps,lasting few hours.  Trigger for her headaches are sleep deprivation, stress,  REVIEW OF SYSTEMS: Full 14 system review of systems performed and notable only for weight gain, fatigue, swelling in legs, hearing loss, ringing ears, spinning sensation, trouble swallowing, rash, blurry vision, eye pain, snoring, constipation, in for note, feeling hot, increased thirst, flushing, joint pain, joint swelling, cramps, achy muscles, allergy, skin sensitivity, memory loss, confusion, headaches, slurred speech, difficulty swallowing, dizziness, seizure, passing out, tremor, sleepiness, snoring, restless legs, depression, anxiety, not enough sleep, decreased energy, disinterested in activities, suicidal thoughts, racing thoughts,  ALLERGIES: Allergies  Allergen Reactions  . Etodolac Hives and Swelling    Throat swelling  . Detrol [Tolterodine] Other (See  Comments)    Chest pain  . Metoprolol Succinate [Metoprolol] Other (See Comments)    Chest pain  . Bupropion     Drug  interaction  . Tizanidine     Drug interaction  . Codeine Nausea Only    HOME MEDICATIONS: Current Outpatient Prescriptions  Medication Sig Dispense Refill  . albuterol (PROVENTIL HFA;VENTOLIN HFA) 108 (90 BASE) MCG/ACT inhaler Inhale 2 puffs into the lungs every 4 (four) hours as needed for wheezing or shortness of breath.    . DULoxetine (CYMBALTA) 60 MG capsule Take 60 mg by mouth daily.    Marland Kitchen estradiol (ESTRACE) 1 MG tablet Take 1 mg by mouth daily.     . fluticasone (FLONASE) 50 MCG/ACT nasal spray Place 1 spray into the nose 2 (two) times daily as needed for rhinitis.    Marland Kitchen HYDROcodone-acetaminophen (NORCO) 7.5-325 MG per tablet TAKE ONE TABLET BY MOUTH EVERY 5-6 HOURS AS NEEDED  0  . meloxicam (MOBIC) 15 MG tablet Take 15 mg by mouth daily.    Marland Kitchen omeprazole (PRILOSEC) 20 MG capsule Take 20 mg by mouth daily.    . promethazine (PHENERGAN) 25 MG tablet Take 25 mg by mouth every 4 (four) hours as needed for nausea or vomiting.     Marland Kitchen tiZANidine (ZANAFLEX) 4 MG tablet Take 4 mg by mouth at bedtime. For leg cramps    . traZODone (DESYREL) 50 MG tablet Take 100 mg by mouth at bedtime.     . valsartan-hydrochlorothiazide (DIOVAN-HCT) 320-25 MG per tablet Take 1 tablet by mouth daily.       PAST MEDICAL HISTORY: Past Medical History  Diagnosis Date  . Hypertension   . Asthma   . Seizures     none since age 49,not on medication  . Arthritis   . GERD (gastroesophageal reflux disease)   . Urinary frequency   . Nocturia   . Joint pain   . Insomnia   . Neck pain, bilateral   . Dizziness   . PONV (postoperative nausea and vomiting)     woke up during a colonoscopy done under sedation  . Headache   . Bell's palsy     PAST SURGICAL HISTORY: Past Surgical History  Procedure Laterality Date  . Appendectomy    . Tonsillectomy    . Carpal tunnel release    . Cesarean section    . Abdominal hysterectomy    . Cholecystectomy    . Lymph node biopsy    . Back surgery    . Right arm  bone fusion      FAMILY HISTORY: Family History  Problem Relation Age of Onset  . Coronary artery disease Father   . Diabetes Father   . Hypertension Mother   . Endometrial cancer Mother   . Hypothyroidism Mother     SOCIAL HISTORY:  History   Social History  . Marital Status: Married    Spouse Name: N/A  . Number of Children: 3  . Years of Education: 12   Occupational History  . Disabled    Social History Main Topics  . Smoking status: Current Every Day Smoker -- 1.00 packs/day for 20 years    Types: Cigarettes  . Smokeless tobacco: Not on file  . Alcohol Use: No     Comment: Heavy drinker - stopped used 1993  . Drug Use: No     Comment: Marijuana use from [phone]  . Sexual Activity: Not on file   Other Topics Concern  . Not on  file   Social History Narrative   Right-handed.   Lives at home with husband.   2-4 cups caffeine per day.    PHYSICAL EXAM   Filed Vitals:   07/10/14 0823  BP: 150/97  Pulse: 91  Height: 5' 6.5" (1.689 m)  Weight: 255 lb (115.667 kg)    Not recorded      Body mass index is 40.55 kg/(m^2).  PHYSICAL EXAMNIATION:  Gen: NAD, conversant, well nourised, obese, well groomed                     Cardiovascular: Regular rate rhythm, no peripheral edema, warm, nontender. Eyes: Conjunctivae clear without exudates or hemorrhage Neck: Supple, no carotid bruise. Pulmonary: Clear to auscultation bilaterally   NEUROLOGICAL EXAM:  MENTAL STATUS: Speech:    Speech is normal; fluent and spontaneous with normal comprehension.  Cognition:    The patient is oriented to person, place, and time;     recent and remote memory intact;     language fluent;     normal attention, concentration,     fund of knowledge.  CRANIAL NERVES: CN II: Visual fields are full to confrontation. Fundoscopic exam is normal with sharp discs and no vascular changes. Pupils were equal round reactive to light CN III, IV, VI: extraocular movement are normal.  No ptosis. CN V: Facial sensation is intact to pinprick in all 3 divisions bilaterally. Corneal responses are intact.  CN VII: she has mild right eye-closure, cheek puff weakness, right facial synergistic movement. CN VIII: Hearing is normal to rubbing fingers CN IX, X: Palate elevates symmetrically. Phonation is normal. CN XI: Head turning and shoulder shrug are intact CN XII: Tongue is midline with normal movements and no atrophy.  MOTOR: There is no pronator drift of out-stretched arms. Muscle bulk and tone are normal. Muscle strength is normal.  REFLEXES: Reflexes are 2+ and symmetric at the biceps, triceps, knees, and ankles. Plantar responses are flexor.  SENSORY: Light touch, pinprick, position sense, and vibration sense are intact in fingers and toes.  COORDINATION: Rapid alternating movements and fine finger movements are intact. There is no dysmetria on finger-to-nose and heel-knee-shin. There are no abnormal or extraneous movements.   GAIT/STANCE: Posture is normal. Gait is steady with normal steps, base, arm swing, and turning. Heel and toe walking are normal. Tandem gait is normal.  Romberg is absent.   DIAGNOSTIC DATA (LABS, IMAGING, TESTING) - I reviewed patient records, labs, notes, testing and imaging myself where available.  Lab Results  Component Value Date   WBC 10.7* 07/08/2012   HGB 13.8 07/08/2012   HCT 40.7 07/08/2012   MCV 87.5 07/08/2012   PLT 201 07/08/2012      Component Value Date/Time   NA 133* 07/08/2012 1827   K 4.4 07/08/2012 1827   CL 96 07/08/2012 1827   CO2 30 07/08/2012 1827   GLUCOSE 77 07/08/2012 1827   BUN 9 07/08/2012 1827   CREATININE 0.93 07/08/2012 1827   CALCIUM 8.9 07/08/2012 1827   PROT 6.7 07/08/2012 1827   ALBUMIN 3.3* 07/08/2012 1827   AST 92* 07/08/2012 1827   ALT 108* 07/08/2012 1827   ALKPHOS 80 07/08/2012 1827   BILITOT 0.9 07/08/2012 1827   GFRNONAA 73* 07/08/2012 1827   GFRAA 84* 07/08/2012 1827     ASSESSMENT AND PLAN  Megan Ochoa is a 49 y.o. female    1. Seizure like event, differentiation include seizure, cataplexy, EEG, will consider referring her to  sleep specialist 2. Headaches, with migraine features, starting Topamax 100 mg twice a day 3. Chronic low back pain, stable, back stretching exercise. NSAIDs, 4. Right facial weakness,right facial synergistic movement, due to partial recovery from previous right Bell's palsy,which is less likely it is related to her small right parotic benign tumor.   Orders Placed This Encounter  Procedures  . EEG adult    New Prescriptions   TOPIRAMATE (TOPAMAX) 100 MG TABLET    1/2 po bid xone week, then one po bid    Return in about 1 month (around 08/09/2014).  Marcial Pacas, M.D. Ph.D.  [facility] [address] [place], [place] [address] Ph: [phone] Fax: [phone]

## 2014-07-17 ENCOUNTER — Ambulatory Visit (INDEPENDENT_AMBULATORY_CARE_PROVIDER_SITE_OTHER): Payer: Commercial Managed Care - HMO | Admitting: Neurology

## 2014-07-17 DIAGNOSIS — R569 Unspecified convulsions: Secondary | ICD-10-CM

## 2014-07-17 DIAGNOSIS — G43009 Migraine without aura, not intractable, without status migrainosus: Secondary | ICD-10-CM

## 2014-07-17 DIAGNOSIS — R29810 Facial weakness: Secondary | ICD-10-CM

## 2014-07-17 NOTE — Procedures (Addendum)
   HISTORY: 49 year old female, presented with frequent headaches, seizure-like activity, facial spasm.  TECHNIQUE:  16 channel EEG was performed based on standard 10-16 international system. One channel was dedicated to EKG, which has demonstrates normal sinus rhythm of 66 beats per minutes.  Upon awakening, the posterior background activity was well-developed, in alpha range, 9 Hz, with amplitude of 20 microvoltage, reactive to eye opening and closure.  There was no evidence of epileptiform discharge. There was frequent muscle artifact and electrode artifacts.  Photic stimulation was performed, which induced a symmetric photic driving.  Hyperventilation was performed, was no abnormality elicited  Stage II sleep was achieved.  CONCLUSION: This is a  normal awake and asleep EEG.  There is no electrodiagnostic evidence of epileptiform discharge

## 2014-07-20 DIAGNOSIS — R569 Unspecified convulsions: Secondary | ICD-10-CM | POA: Insufficient documentation

## 2014-08-21 ENCOUNTER — Encounter: Payer: Self-pay | Admitting: Neurology

## 2014-08-21 ENCOUNTER — Ambulatory Visit (INDEPENDENT_AMBULATORY_CARE_PROVIDER_SITE_OTHER): Payer: Commercial Managed Care - HMO | Admitting: Neurology

## 2014-08-21 VITALS — BP 108/71 | HR 73 | Ht 66.5 in | Wt 244.0 lb

## 2014-08-21 DIAGNOSIS — R569 Unspecified convulsions: Secondary | ICD-10-CM

## 2014-08-21 DIAGNOSIS — R29810 Facial weakness: Secondary | ICD-10-CM

## 2014-08-21 DIAGNOSIS — G43009 Migraine without aura, not intractable, without status migrainosus: Secondary | ICD-10-CM | POA: Diagnosis not present

## 2014-08-21 MED ORDER — TOPIRAMATE 100 MG PO TABS: ORAL_TABLET | ORAL | Status: DC

## 2014-08-21 MED ORDER — RIZATRIPTAN BENZOATE 5 MG PO TBDP: 5.0000 mg | ORAL_TABLET | ORAL | Status: AC | PRN

## 2014-08-21 NOTE — Progress Notes (Signed)
Chief Complaint  Patient presents with  . Seizure-like activity    She would like to discuss her EEG results.  . Migraines    She continues to have daily headaches despite taking topiramate 100mg  twice daily.      PATIENT: Megan Ochoa DOB: [birth year]  Chief Complaint  Patient presents with  . Seizure-like activity    She would like to discuss her EEG results.  . Migraines    She continues to have daily headaches despite taking topiramate 100mg  twice daily.    HISTORICAL  Megan Ochoa is a 61 right-handed female, accompanied by her mother-in-law, seen in refer by ENT Dr. Melissa Montane, and her primary care physician Dr. Leanord Asal for evaluation of severe headaches, seizure-like activity, right facial spasm  I have reviewed referring notes from Dr. Melissa Montane, and her primary care doctor Greig Right.  Patient reported a history of seizure disorder, generalized tonic clonic seizure at age 2, recurrent episodes, was treated with Dilantin up to 500 mg every day until age 68,Dilantin was tapered off, after she stopped having seizure-like activity.  Right facial weakness, history of recurrent right facial nerve palsy. She had her first right Bell's palsy in 1990s, weakness involving her right upper and lower face, gradually recovered over 6-8 weeks course, but she never achieved total recovery, at baseline, she has mild asymmetry of her face, since the initial episode, she had multiple recurrent episodes of right facial weakness, most recent one was November 2015,   I also reviewed record from [facility],  report of MRI of the neck in Jun 05 2002,  multiple lymph node in V level bilaterally, largest on the left side, superficial palpable lymph note measure approximately 1.0 centimeter, per report. There was no evidence of necrotic center, she had needled biopsy of lymph node "abnormal cells", but there was no treatment offered.  MRI in January 2016 [facility] reported  a right parotid mass 1 .3 cm, there was needle biopsy performed, per patient, that was benign, no surgery was offered.  She has a history of chronic low back pain, had a history of lumbar decompression, and fusion surgery in the past,  most one was in Nov 2015, she continue complains of low back pain, is taking chronic hydrocodone.  Since 2014, she had recurrent episode of sudden loss of muscle tone,but no loss of consciousness, usually induced by prolonged, hard laughing, her eyes rolled back, difficulty controlling her arm and legs, but no dysarthria, she also complains of excessive daytime, sleepiness, but she denies episodes of sudden sleep attacks, or hallucinations, vivid dreams. This episode is different from her previous history of seizure.  She had a previous occasionally headache, getting worse since October 2015, to almost daily bases, right occipital area, spreading to vortex region, no light noise sensitivity, sometimes nauseous, sleep helps,lasting few hours.  Trigger for her headaches are sleep deprivation, stress  UPDATE August 21 2014: Megan Ochoa is with her mother-in-law, and her husband at today's clinical visit, she reported Topamax 100 mg twice a day was somewhat helpful, she has much less seizure-like event, but continue have moderate 6 out of 10 right vortex area headache, it can go up to 10 out of 10 lasting for few minutes, with associated light noise sensitivity  She also complains of difficulty sleeping, excessive movement during sleep  Neither patients, all family members could elaborate on the details of seizure-like event, vague description of eyes rolling back, unresponsive, loss of muscle tone,  EEG was normal in July 20 2014.    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, normal MRI of the brain,  REVIEW OF SYSTEMS: Full 14 system review of systems performed and notable only for  fatigue, facial swelling, ear discharge, ear pain, ringing ears, trouble swallowing, eye pain, blurry vision, shortness of breath, excessive thirst, swollen abdomen, abdomen pain, constipation, diarrhea, nausea, rectal pain, insomnia, snoring, urinary urgency, joints pain, joint swelling, back pain, achy muscles, muscle cramps, walking difficulty, neck pain, neck stiffness, swallowing needing for note, memory loss, dizziness, headaches, numbness, speech difficulty, weakness, tremor, passing out, facial drooping, confusion, depression anxiety   ALLERGIES: Allergies  Allergen Reactions  . Etodolac Hives and Swelling    Throat swelling  . Detrol [Tolterodine] Other (See Comments)    Chest pain  . Metoprolol Succinate [Metoprolol] Other (See Comments)    Chest pain  . Bupropion     Drug interaction  . Tizanidine     Drug interaction  . Codeine Nausea Only    HOME MEDICATIONS: Current Outpatient Prescriptions  Medication Sig Dispense Refill  . albuterol (PROVENTIL HFA;VENTOLIN HFA) 108 (90 BASE) MCG/ACT inhaler Inhale 2 puffs into the lungs every 4 (four) hours as needed for wheezing or shortness of breath.    . DULoxetine (CYMBALTA) 60 MG capsule Take 60 mg by mouth daily.    Marland Kitchen estradiol (ESTRACE) 1 MG tablet Take 1 mg by mouth daily.     . fluticasone (FLONASE) 50 MCG/ACT nasal spray Place 1 spray into the nose 2 (two) times daily as needed for rhinitis.    Marland Kitchen HYDROcodone-acetaminophen (NORCO) 7.5-325 MG per tablet TAKE ONE TABLET BY MOUTH EVERY 5-6 HOURS AS NEEDED  0  . meloxicam (MOBIC) 15 MG tablet Take 15 mg by mouth daily.    Marland Kitchen omeprazole (PRILOSEC) 20 MG capsule Take 20 mg by mouth daily.    . promethazine (PHENERGAN) 25 MG tablet Take 25 mg by mouth every 4 (four) hours as needed for nausea or vomiting.     Marland Kitchen tiZANidine  (ZANAFLEX) 4 MG tablet Take 4 mg by mouth at bedtime. For leg cramps    . traZODone (DESYREL) 50 MG tablet Take 100 mg by mouth at bedtime.     . valsartan-hydrochlorothiazide (DIOVAN-HCT) 320-25 MG per tablet Take 1 tablet by mouth daily.       PAST MEDICAL HISTORY: Past Medical History  Diagnosis Date  . Hypertension   . Asthma   . Seizures     none since age 80,not on medication  . Arthritis   . GERD (gastroesophageal reflux disease)   . Urinary frequency   . Nocturia   . Joint pain   . Insomnia   . Neck pain, bilateral   . Dizziness   . PONV (postoperative nausea and vomiting)     woke up during a colonoscopy done under sedation  . Headache   . Bell's palsy     PAST SURGICAL HISTORY: Past Surgical History  Procedure Laterality Date  . Appendectomy    . Tonsillectomy    . Carpal tunnel release    . Cesarean section    . Abdominal hysterectomy    . Cholecystectomy    . Lymph node biopsy    . Back  surgery    . Right arm bone fusion      FAMILY HISTORY: Family History  Problem Relation Age of Onset  . Coronary artery disease Father   . Diabetes Father   . Hypertension Mother   . Endometrial cancer Mother   . Hypothyroidism Mother   . Hypertension Father     SOCIAL HISTORY:  History   Social History  . Marital Status: Married    Spouse Name: N/A  . Number of Children: 3  . Years of Education: 12   Occupational History  . Disabled    Social History Main Topics  . Smoking status: Current Every Day Smoker -- 1.00 packs/day for 20 years    Types: Cigarettes  . Smokeless tobacco: Not on file  . Alcohol Use: No     Comment: Heavy drinker - stopped used 1993  . Drug Use: No     Comment: Marijuana use from [phone]  . Sexual Activity: Not on file   Other Topics Concern  . Not on file   Social History Narrative   Right-handed.   Lives at home with husband.   2-4 cups caffeine per day.    PHYSICAL EXAM   Filed Vitals:   08/21/14 1506  BP:  108/71  Pulse: 73  Height: 5' 6.5" (1.689 m)  Weight: 244 lb (110.678 kg)    Not recorded      Body mass index is 38.8 kg/(m^2).  PHYSICAL EXAMNIATION:  Gen: NAD, conversant, well nourised, obese, well groomed                     Cardiovascular: Regular rate rhythm, no peripheral edema, warm, nontender. Eyes: Conjunctivae clear without exudates or hemorrhage Neck: Supple, no carotid bruise. Pulmonary: Clear to auscultation bilaterally   NEUROLOGICAL EXAM:  MENTAL STATUS: Speech:    Speech is normal; fluent and spontaneous with normal comprehension.  Cognition:    The patient is oriented to person, place, and time;     recent and remote memory intact;     language fluent;     normal attention, concentration,     fund of knowledge.  CRANIAL NERVES: CN II: Visual fields are full to confrontation. Fundoscopic exam is normal with sharp discs and no vascular changes. Pupils were equal round reactive to light CN III, IV, VI: extraocular movement are normal. No ptosis. CN V: Facial sensation is intact to pinprick in all 3 divisions bilaterally. Corneal responses are intact.  CN VII: she has mild right eye-closure, cheek puff weakness, right facial synergistic movement. CN VIII: Hearing is normal to rubbing fingers CN IX, X: Palate elevates symmetrically. Phonation is normal. CN XI: Head turning and shoulder shrug are intact CN XII: Tongue is midline with normal movements and no atrophy.  MOTOR: There is no pronator drift of out-stretched arms. Muscle bulk and tone are normal. Muscle strength is normal.  REFLEXES: Reflexes are 2+ and symmetric at the biceps, triceps, knees, and ankles. Plantar responses are flexor.  SENSORY: Light touch, pinprick, position sense, and vibration sense are intact in fingers and toes.  COORDINATION: Rapid alternating movements and fine finger movements are intact. There is no dysmetria on finger-to-nose and heel-knee-shin. There are no abnormal  or extraneous movements.   GAIT/STANCE: Posture is normal. Gait is steady with normal steps, base, arm swing, and turning. Heel and toe walking are normal. Tandem gait is normal.  Romberg is absent.   DIAGNOSTIC DATA (LABS, IMAGING, TESTING) - I reviewed patient  records, labs, notes, testing and imaging myself where available.  Lab Results  Component Value Date   WBC 10.7* 07/08/2012   HGB 13.8 07/08/2012   HCT 40.7 07/08/2012   MCV 87.5 07/08/2012   PLT 201 07/08/2012      Component Value Date/Time   NA 133* 07/08/2012 1827   K 4.4 07/08/2012 1827   CL 96 07/08/2012 1827   CO2 30 07/08/2012 1827   GLUCOSE 77 07/08/2012 1827   BUN 9 07/08/2012 1827   CREATININE 0.93 07/08/2012 1827   CALCIUM 8.9 07/08/2012 1827   PROT 6.7 07/08/2012 1827   ALBUMIN 3.3* 07/08/2012 1827   AST 92* 07/08/2012 1827   ALT 108* 07/08/2012 1827   ALKPHOS 80 07/08/2012 1827   BILITOT 0.9 07/08/2012 1827   GFRNONAA 73* 07/08/2012 1827   GFRAA 84* 07/08/2012 1827    ASSESSMENT AND PLAN  SCOUT GUYETT is a 49 y.o. female    1. Seizure like event, differentiation include seizureversus pseudoseizure  2. Headaches, with migraine features, starting  increase Topamax 100 mg  one and half tablets twice a day, Maxalt as needed 3. Document all seizure-like event, if she continue have recurrent episode while on titrating dose of Topamax, may consider video EEG monitoring    Marcial Pacas, M.D. Ph.D.  [facility] [address], [place], [place] [address] Ph: [phone] Fax: [phone]

## 2014-09-06 ENCOUNTER — Telehealth: Payer: Self-pay | Admitting: Neurology

## 2014-09-06 NOTE — Telephone Encounter (Signed)
Spoke to Dr. Krista Blue and returned call - patient reports having visual disturbances during an active migraine ("sparkles") - her headache resolved with Maxalt and rest.  She is currently under the care of a psychiatrist.  She was instructed to contact his office for treatment of her anxiety and paranoia.

## 2014-09-06 NOTE — Telephone Encounter (Signed)
Patient called stating she was watching a violent movie with her husband Saturday night and she started having bad anxiety and thought what was happening in the movie was going to happen to her. She has since not been able to watch TV without feeling she is literally involved with the program. It only occurs while watching TV. She states she still experiences paranoia while around a lot of people. She states on July 26th she took  rizatriptan (MAXALT-MLT) 5 MG disintegrating tablet for a headache and about 45 minute later she saw sparkles and stars until she fell asleep. Please call and advise.  Patient can be reached at [phone].

## 2014-10-09 ENCOUNTER — Ambulatory Visit (INDEPENDENT_AMBULATORY_CARE_PROVIDER_SITE_OTHER): Payer: Commercial Managed Care - HMO | Admitting: Neurology

## 2014-10-09 ENCOUNTER — Encounter: Payer: Self-pay | Admitting: Neurology

## 2014-10-09 VITALS — BP 113/71 | HR 84 | Ht 66.5 in | Wt 242.0 lb

## 2014-10-09 DIAGNOSIS — R569 Unspecified convulsions: Secondary | ICD-10-CM

## 2014-10-09 DIAGNOSIS — R29810 Facial weakness: Secondary | ICD-10-CM

## 2014-10-09 DIAGNOSIS — G43009 Migraine without aura, not intractable, without status migrainosus: Secondary | ICD-10-CM | POA: Diagnosis not present

## 2014-10-09 NOTE — Progress Notes (Signed)
Chief Complaint  Patient presents with  . Migraine    She is still swollen on the right side of her face and having frequent sharp pains through head.  Says she has been having memory problems since her increase in Topamax.  She has not had any further seizure-like events since last seen in July 2016.  She missed the calls to have her MRI scheduled but would still like to have the scan.   Chief Complaint  Patient presents with  . Migraine    She is still swollen on the right side of her face and having frequent sharp pains through head.  Says she has been having memory problems since her increase in Topamax.  She has not had any further seizure-like events since last seen in July 2016.  She missed the calls to have her MRI scheduled but would still like to have the scan.      PATIENT: Megan Ochoa DOB: Nov 23, 1965  Chief Complaint  Patient presents with  . Migraine    She is still swollen on the right side of her face and having frequent sharp pains through head.  Says she has been having memory problems since her increase in Topamax.  She has not had any further seizure-like events since last seen in July 2016.  She missed the calls to have her MRI scheduled but would still like to have the scan.    HISTORICAL  Megan Ochoa is a 51 right-handed female, accompanied by her mother-in-law, seen in refer by ENT Dr. Melissa Montane, and her primary care physician Dr. Leanord Asal for evaluation of severe headaches, seizure-like activity, right facial spasm  I have reviewed referring notes from Dr. Melissa Montane, and her primary care doctor Greig Right.  Patient reported a history of seizure disorder, generalized tonic clonic seizure at age 76, recurrent episodes, was treated with Dilantin up to 500 mg every day until age 7,Dilantin was tapered off, after she stopped having seizure-like activity.  Right facial weakness, history of recurrent right facial nerve palsy. She had her first right  Bell's palsy in 1990s, weakness involving her right upper and lower face, gradually recovered over 6-8 weeks course, but she never achieved total recovery, at baseline, she has mild asymmetry of her face, since the initial episode, she had multiple recurrent episodes of right facial weakness, most recent one was November 2015,   I also reviewed record from [facility],  report of MRI of the neck in Jun 05 2002,  multiple lymph node in V level bilaterally, largest on the left side, superficial palpable lymph note measure approximately 1.0 centimeter, per report. There was no evidence of necrotic center, she had needled biopsy of lymph node "abnormal cells", but there was no treatment offered.  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  She has a history of chronic low back pain, had a history of lumbar decompression, and fusion surgery in the past,  most one was in Nov 2015, she continue complains of low back pain, is taking chronic hydrocodone.  Since 2014, she had recurrent episode of sudden loss of muscle tone,but no loss of consciousness, usually induced by prolonged, hard laughing, her eyes rolled back, difficulty controlling her arm and legs, but no dysarthria, she also complains of excessive daytime, sleepiness, but she denies episodes of sudden sleep attacks, or hallucinations, vivid dreams.  This episode is different from her previous history of seizure.  She had a previous occasionally headache, getting worse since October 2015, to almost daily bases, right occipital area, spreading to vortex region, no light noise sensitivity, sometimes nauseous, sleep helps,lasting few hours.  Trigger for her headaches are sleep deprivation, stress  UPDATE August 21 2014: Megan Ochoa is with her mother-in-law, and her husband at today's clinical visit, she reported Topamax 100 mg twice a day  was somewhat helpful, she has much less seizure-like event, but continue have moderate 6 out of 10 right vortex area headache, it can go up to 10 out of 10 lasting for few minutes, with associated light noise sensitivity  She also complains of difficulty sleeping, excessive movement during sleep  Neither patients, all family members could elaborate on the details of seizure-like event, vague description of eyes rolling back, unresponsive, loss of muscle tone,   EEG was normal in July 20 2014.    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  She has chronic low back pain, previous lumbar decompression surgery, right wrist surgery, is taking Norco as needed  REVIEW OF SYSTEMS: Full 14 system review of systems performed and notable only for  memory loss, confusion, weakness slurred speech, difficulty swallowing, dizziness, tremor, insomnia, depression anxiety not enough sleep, change in appetite disinteresting activities, diarrhea constipation, ringing ears spinning sensation trouble swallowing  ALLERGIES: Allergies  Allergen Reactions  . Etodolac Hives and Swelling    Throat swelling  . Detrol [Tolterodine] Other (See Comments)    Chest pain  . Metoprolol Succinate [Metoprolol] Other (See Comments)    Chest pain  . Bupropion     Drug interaction  . Tizanidine     Drug interaction  . Codeine Nausea Only     HOME MEDICATIONS: Current Outpatient Prescriptions  Medication Sig Dispense Refill  . albuterol (PROVENTIL HFA;VENTOLIN HFA) 108 (90 BASE) MCG/ACT inhaler Inhale 2 puffs into the lungs every 4 (four) hours as needed for wheezing or shortness of breath.    . DULoxetine (CYMBALTA) 60 MG capsule Take 60 mg by mouth daily.    Marland Kitchen estradiol (ESTRACE) 1 MG tablet Take 1 mg by mouth daily.     . fluticasone (FLONASE) 50 MCG/ACT nasal spray Place 1 spray into the nose 2 (two) times daily as needed for rhinitis.    Marland Kitchen HYDROcodone-acetaminophen (NORCO) 7.5-325 MG per tablet TAKE ONE TABLET BY MOUTH EVERY 5-6 HOURS AS NEEDED  0  . meloxicam (MOBIC) 15 MG tablet Take 15 mg by mouth daily.    Marland Kitchen omeprazole (PRILOSEC) 20 MG capsule Take 20 mg by mouth daily.    . promethazine (PHENERGAN) 25 MG tablet Take 25 mg by mouth every 4 (four) hours as needed for nausea or vomiting.     Marland Kitchen tiZANidine (ZANAFLEX) 4 MG tablet Take 4 mg by mouth at bedtime. For leg cramps    .  traZODone (DESYREL) 50 MG tablet Take 100 mg by mouth at bedtime.     . valsartan-hydrochlorothiazide (DIOVAN-HCT) 320-25 MG per tablet Take 1 tablet by mouth daily.       PAST MEDICAL HISTORY: Past Medical History  Diagnosis Date  . Hypertension   . Asthma   . Seizures     none since age 28,not on medication  . Arthritis   . GERD (gastroesophageal reflux disease)   . Urinary frequency   . Nocturia   . Joint pain   . Insomnia   . Neck pain, bilateral   . Dizziness   . PONV (postoperative nausea and vomiting)     woke up during a colonoscopy done under sedation  . Headache   . Bell's palsy     PAST SURGICAL HISTORY: Past Surgical History  Procedure Laterality Date  . Appendectomy    . Tonsillectomy    . Carpal tunnel release    . Cesarean section    . Abdominal hysterectomy    . Cholecystectomy    . Lymph node biopsy    . Back surgery    . Right arm bone fusion      FAMILY HISTORY: Family History  Problem Relation  Age of Onset  . Coronary artery disease Father   . Diabetes Father   . Hypertension Mother   . Endometrial cancer Mother   . Hypothyroidism Mother   . Hypertension Father     SOCIAL HISTORY:  History   Social History  . Marital Status: Married    Spouse Name: N/A  . Number of Children: 3  . Years of Education: 12   Occupational History  . Disabled    Social History Main Topics  . Smoking status: Current Every Day Smoker -- 1.00 packs/day for 20 years    Types: Cigarettes  . Smokeless tobacco: Not on file  . Alcohol Use: No     Comment: Heavy drinker - stopped used 1993  . Drug Use: No     Comment: Marijuana use from [phone]  . Sexual Activity: Not on file   Other Topics Concern  . Not on file   Social History Narrative   Right-handed.   Lives at home with husband.   2-4 cups caffeine per day.    PHYSICAL EXAM   Filed Vitals:   10/09/14 1505  BP: 113/71  Pulse: 84  Height: 5' 6.5" (1.689 m)  Weight: 242 lb (109.77 kg)    Not recorded      Body mass index is 38.48 kg/(m^2).  PHYSICAL EXAMNIATION:  Gen: NAD, conversant, well nourised, obese, well groomed                     Cardiovascular: Regular rate rhythm, no peripheral edema, warm, nontender. Eyes: Conjunctivae clear without exudates or hemorrhage Neck: Supple, no carotid bruise. Pulmonary: Clear to auscultation bilaterally   NEUROLOGICAL EXAM:  MENTAL STATUS: Speech:    Speech is normal; fluent and spontaneous with normal comprehension.  Cognition:    The patient is oriented to person, place, and time;     recent and remote memory intact;     language fluent;     normal attention, concentration,     fund of knowledge.  CRANIAL NERVES: CN II: Visual fields are full to confrontation. Fundoscopic exam is normal with sharp discs and no vascular changes. Pupils were equal round reactive to light CN III, IV, VI: extraocular movement are normal. No ptosis. CN V: Facial  sensation is intact  to pinprick in all 3 divisions bilaterally. Corneal responses are intact.  CN VII: she has mild right eye-closure, cheek puff weakness, right facial synergistic movement. CN VIII: Hearing is normal to rubbing fingers CN IX, X: Palate elevates symmetrically. Phonation is normal. CN XI: Head turning and shoulder shrug are intact CN XII: Tongue is midline with normal movements and no atrophy.  MOTOR: There is no pronator drift of out-stretched arms. Muscle bulk and tone are normal. Muscle strength is normal.  REFLEXES: Reflexes are 2+ and symmetric at the biceps, triceps, knees, and ankles. Plantar responses are flexor.  SENSORY: Light touch, pinprick, position sense, and vibration sense are intact in fingers and toes.  COORDINATION: Rapid alternating movements and fine finger movements are intact. There is no dysmetria on finger-to-nose and heel-knee-shin.  GAIT/STANCE: Mild antalgic Romberg is absent.   DIAGNOSTIC DATA (LABS, IMAGING, TESTING) - I reviewed patient records, labs, notes, testing and imaging myself where available.  Lab Results  Component Value Date   WBC 10.7* 07/08/2012   HGB 13.8 07/08/2012   HCT 40.7 07/08/2012   MCV 87.5 07/08/2012   PLT 201 07/08/2012      Component Value Date/Time   NA 133* 07/08/2012 1827   K 4.4 07/08/2012 1827   CL 96 07/08/2012 1827   CO2 30 07/08/2012 1827   GLUCOSE 77 07/08/2012 1827   BUN 9 07/08/2012 1827   CREATININE 0.93 07/08/2012 1827   CALCIUM 8.9 07/08/2012 1827   PROT 6.7 07/08/2012 1827   ALBUMIN 3.3* 07/08/2012 1827   AST 92* 07/08/2012 1827   ALT 108* 07/08/2012 1827   ALKPHOS 80 07/08/2012 1827   BILITOT 0.9 07/08/2012 1827   GFRNONAA 73* 07/08/2012 1827   GFRAA 84* 07/08/2012 1827    ASSESSMENT AND PLAN  Megan Ochoa is a 49 y.o. female    1. Seizure like event, differentiation include seizureversus pseudoseizure, normal EEG, no recurrent event while taking Topamax 150 mg twice a day  2.  Headaches, with migraine features, continue Topamax, Maxalt as needed 3. MRI of the brain as previously scheduled  Marcial Pacas, M.D. Ph.D.  [facility] [place], [place] [address] Phone: [phone] Fax:      [phone]

## 2014-10-13 ENCOUNTER — Other Ambulatory Visit: Payer: Commercial Managed Care - PPO

## 2014-10-22 ENCOUNTER — Ambulatory Visit
Admission: RE | Admit: 2014-10-22 | Discharge: 2014-10-22 | Disposition: A | Discharge status: Home / Self Care | Payer: Commercial Managed Care - PPO | Source: Ambulatory Visit | Attending: Neurology | Admitting: Neurology

## 2014-10-22 DIAGNOSIS — R29810 Facial weakness: Secondary | ICD-10-CM

## 2014-10-22 DIAGNOSIS — G43009 Migraine without aura, not intractable, without status migrainosus: Secondary | ICD-10-CM | POA: Diagnosis not present

## 2014-10-22 DIAGNOSIS — R569 Unspecified convulsions: Secondary | ICD-10-CM

## 2014-10-24 ENCOUNTER — Telehealth: Payer: Self-pay | Admitting: Neurology

## 2014-10-24 NOTE — Telephone Encounter (Signed)
Spoke to patient - aware of results and will keep follow up appt in November, as scheduled.

## 2014-10-24 NOTE — Telephone Encounter (Signed)
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 The brain appears normal before and after contrast. There are no acute findings. 2. There is a 139 mm parotid nodule. 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 It is essentially unchanged when compared to the MRI dated 02/10/2014. 3. Chronic right mastoid effusion, unchanged since 02/10/2014.

## 2014-12-11 ENCOUNTER — Ambulatory Visit: Payer: Commercial Managed Care - HMO | Admitting: Nurse Practitioner

## 2015-02-26 DIAGNOSIS — J341 Cyst and mucocele of nose and nasal sinus: Secondary | ICD-10-CM | POA: Diagnosis not present

## 2015-02-26 DIAGNOSIS — K1239 Other oral mucositis (ulcerative): Secondary | ICD-10-CM | POA: Diagnosis not present

## 2015-02-26 DIAGNOSIS — R944 Abnormal results of kidney function studies: Secondary | ICD-10-CM | POA: Diagnosis not present

## 2015-02-26 DIAGNOSIS — M545 Low back pain: Secondary | ICD-10-CM | POA: Diagnosis not present

## 2015-02-26 DIAGNOSIS — Z79891 Long term (current) use of opiate analgesic: Secondary | ICD-10-CM | POA: Diagnosis not present

## 2015-03-05 DIAGNOSIS — J329 Chronic sinusitis, unspecified: Secondary | ICD-10-CM | POA: Diagnosis not present

## 2015-03-05 DIAGNOSIS — R51 Headache: Secondary | ICD-10-CM | POA: Diagnosis not present

## 2015-03-05 DIAGNOSIS — J342 Deviated nasal septum: Secondary | ICD-10-CM | POA: Diagnosis not present

## 2015-03-05 DIAGNOSIS — J343 Hypertrophy of nasal turbinates: Secondary | ICD-10-CM | POA: Diagnosis not present

## 2015-03-05 DIAGNOSIS — J3489 Other specified disorders of nose and nasal sinuses: Secondary | ICD-10-CM | POA: Diagnosis not present

## 2015-03-05 DIAGNOSIS — Z8719 Personal history of other diseases of the digestive system: Secondary | ICD-10-CM | POA: Diagnosis not present

## 2015-03-26 DIAGNOSIS — J342 Deviated nasal septum: Secondary | ICD-10-CM | POA: Diagnosis not present

## 2015-03-26 DIAGNOSIS — J329 Chronic sinusitis, unspecified: Secondary | ICD-10-CM | POA: Diagnosis not present

## 2015-03-26 DIAGNOSIS — J3489 Other specified disorders of nose and nasal sinuses: Secondary | ICD-10-CM | POA: Diagnosis not present

## 2015-03-30 DIAGNOSIS — J342 Deviated nasal septum: Secondary | ICD-10-CM | POA: Diagnosis not present

## 2015-03-30 DIAGNOSIS — J343 Hypertrophy of nasal turbinates: Secondary | ICD-10-CM | POA: Diagnosis not present

## 2015-03-30 DIAGNOSIS — J3489 Other specified disorders of nose and nasal sinuses: Secondary | ICD-10-CM | POA: Diagnosis not present

## 2015-03-30 DIAGNOSIS — R0981 Nasal congestion: Secondary | ICD-10-CM | POA: Diagnosis not present

## 2015-04-05 ENCOUNTER — Telehealth: Payer: Self-pay | Admitting: Neurology

## 2015-04-05 NOTE — Telephone Encounter (Signed)
She is having a deviated septum repaired. Per Dr. Krista Blue, continue medication, as prescribed. Spoke to [place] to make her aware.  She said her ENT may call us.

## 2015-04-05 NOTE — Telephone Encounter (Signed)
Patient is calling. She is having surgery 04-10-15 at [facility]. She states the hospital wanted her to advise our office of the upcoming surgery in case the patient might have a seizure and if there were any concerns. The patient did not have a contact person for the hospital.  Please call the patient for any questions.

## 2015-04-06 DIAGNOSIS — I10 Essential (primary) hypertension: Secondary | ICD-10-CM | POA: Diagnosis not present

## 2015-04-06 DIAGNOSIS — Z01810 Encounter for preprocedural cardiovascular examination: Secondary | ICD-10-CM | POA: Diagnosis not present

## 2015-04-06 DIAGNOSIS — Z01818 Encounter for other preprocedural examination: Secondary | ICD-10-CM | POA: Diagnosis not present

## 2015-04-09 NOTE — Telephone Encounter (Signed)
[facility] called regarding surgical clearance for patient. Surgery is scheduled for tomorrow. Please call [phone] ext [phone].

## 2015-04-10 DIAGNOSIS — G629 Polyneuropathy, unspecified: Secondary | ICD-10-CM | POA: Diagnosis not present

## 2015-04-10 DIAGNOSIS — G4700 Insomnia, unspecified: Secondary | ICD-10-CM | POA: Diagnosis not present

## 2015-04-10 DIAGNOSIS — R0981 Nasal congestion: Secondary | ICD-10-CM | POA: Diagnosis not present

## 2015-04-10 DIAGNOSIS — J3489 Other specified disorders of nose and nasal sinuses: Secondary | ICD-10-CM | POA: Diagnosis not present

## 2015-04-10 DIAGNOSIS — K219 Gastro-esophageal reflux disease without esophagitis: Secondary | ICD-10-CM | POA: Diagnosis not present

## 2015-04-10 DIAGNOSIS — Z79899 Other long term (current) drug therapy: Secondary | ICD-10-CM | POA: Diagnosis not present

## 2015-04-10 DIAGNOSIS — F419 Anxiety disorder, unspecified: Secondary | ICD-10-CM | POA: Diagnosis not present

## 2015-04-10 DIAGNOSIS — J310 Chronic rhinitis: Secondary | ICD-10-CM | POA: Diagnosis not present

## 2015-04-10 DIAGNOSIS — E039 Hypothyroidism, unspecified: Secondary | ICD-10-CM | POA: Diagnosis not present

## 2015-04-10 DIAGNOSIS — I10 Essential (primary) hypertension: Secondary | ICD-10-CM | POA: Diagnosis not present

## 2015-04-10 DIAGNOSIS — E119 Type 2 diabetes mellitus without complications: Secondary | ICD-10-CM | POA: Diagnosis not present

## 2015-04-10 DIAGNOSIS — F17210 Nicotine dependence, cigarettes, uncomplicated: Secondary | ICD-10-CM | POA: Diagnosis not present

## 2015-04-10 DIAGNOSIS — F329 Major depressive disorder, single episode, unspecified: Secondary | ICD-10-CM | POA: Diagnosis not present

## 2015-04-10 DIAGNOSIS — E785 Hyperlipidemia, unspecified: Secondary | ICD-10-CM | POA: Diagnosis not present

## 2015-04-10 DIAGNOSIS — J343 Hypertrophy of nasal turbinates: Secondary | ICD-10-CM | POA: Diagnosis not present

## 2015-04-10 DIAGNOSIS — J45909 Unspecified asthma, uncomplicated: Secondary | ICD-10-CM | POA: Diagnosis not present

## 2015-04-10 DIAGNOSIS — J449 Chronic obstructive pulmonary disease, unspecified: Secondary | ICD-10-CM | POA: Diagnosis not present

## 2015-04-10 DIAGNOSIS — G4730 Sleep apnea, unspecified: Secondary | ICD-10-CM | POA: Diagnosis not present

## 2015-04-10 DIAGNOSIS — J342 Deviated nasal septum: Secondary | ICD-10-CM | POA: Diagnosis not present

## 2015-04-10 DIAGNOSIS — M1990 Unspecified osteoarthritis, unspecified site: Secondary | ICD-10-CM | POA: Diagnosis not present

## 2015-04-10 DIAGNOSIS — G43909 Migraine, unspecified, not intractable, without status migrainosus: Secondary | ICD-10-CM | POA: Diagnosis not present

## 2015-04-10 NOTE — Telephone Encounter (Signed)
Megan Ochoa aware of Dr. Rhea Belton response.  Clearance faxed to her attention at [phone].

## 2015-04-23 DIAGNOSIS — R7301 Impaired fasting glucose: Secondary | ICD-10-CM | POA: Diagnosis not present

## 2015-04-23 DIAGNOSIS — F329 Major depressive disorder, single episode, unspecified: Secondary | ICD-10-CM | POA: Diagnosis not present

## 2015-04-23 DIAGNOSIS — E7800 Pure hypercholesterolemia, unspecified: Secondary | ICD-10-CM | POA: Diagnosis not present

## 2015-04-23 DIAGNOSIS — K219 Gastro-esophageal reflux disease without esophagitis: Secondary | ICD-10-CM | POA: Diagnosis not present

## 2015-04-23 DIAGNOSIS — I10 Essential (primary) hypertension: Secondary | ICD-10-CM | POA: Diagnosis not present

## 2015-04-23 DIAGNOSIS — E669 Obesity, unspecified: Secondary | ICD-10-CM | POA: Diagnosis not present

## 2015-04-23 DIAGNOSIS — F17200 Nicotine dependence, unspecified, uncomplicated: Secondary | ICD-10-CM | POA: Diagnosis not present

## 2015-04-23 DIAGNOSIS — Z6838 Body mass index (BMI) 38.0-38.9, adult: Secondary | ICD-10-CM | POA: Diagnosis not present

## 2015-05-07 DIAGNOSIS — J3489 Other specified disorders of nose and nasal sinuses: Secondary | ICD-10-CM | POA: Diagnosis not present

## 2015-06-13 DIAGNOSIS — M545 Low back pain: Secondary | ICD-10-CM | POA: Diagnosis not present

## 2015-06-13 DIAGNOSIS — K5900 Constipation, unspecified: Secondary | ICD-10-CM | POA: Diagnosis not present

## 2015-06-13 DIAGNOSIS — R1084 Generalized abdominal pain: Secondary | ICD-10-CM | POA: Diagnosis not present

## 2015-06-29 DIAGNOSIS — R609 Edema, unspecified: Secondary | ICD-10-CM | POA: Diagnosis not present

## 2015-08-13 DIAGNOSIS — F17200 Nicotine dependence, unspecified, uncomplicated: Secondary | ICD-10-CM | POA: Diagnosis not present

## 2015-08-13 DIAGNOSIS — K5900 Constipation, unspecified: Secondary | ICD-10-CM | POA: Diagnosis not present

## 2015-08-13 DIAGNOSIS — I10 Essential (primary) hypertension: Secondary | ICD-10-CM | POA: Diagnosis not present

## 2015-08-13 DIAGNOSIS — Z23 Encounter for immunization: Secondary | ICD-10-CM | POA: Diagnosis not present

## 2015-08-13 DIAGNOSIS — Z0000 Encounter for general adult medical examination without abnormal findings: Secondary | ICD-10-CM | POA: Diagnosis not present

## 2015-08-13 DIAGNOSIS — E669 Obesity, unspecified: Secondary | ICD-10-CM | POA: Diagnosis not present

## 2015-08-13 DIAGNOSIS — Z6838 Body mass index (BMI) 38.0-38.9, adult: Secondary | ICD-10-CM | POA: Diagnosis not present

## 2015-08-13 DIAGNOSIS — F321 Major depressive disorder, single episode, moderate: Secondary | ICD-10-CM | POA: Diagnosis not present

## 2015-08-13 DIAGNOSIS — E7800 Pure hypercholesterolemia, unspecified: Secondary | ICD-10-CM | POA: Diagnosis not present

## 2015-08-13 DIAGNOSIS — N951 Menopausal and female climacteric states: Secondary | ICD-10-CM | POA: Diagnosis not present

## 2015-08-13 DIAGNOSIS — R42 Dizziness and giddiness: Secondary | ICD-10-CM | POA: Diagnosis not present

## 2015-08-22 ENCOUNTER — Other Ambulatory Visit: Payer: Self-pay | Admitting: Neurology

## 2015-09-19 ENCOUNTER — Other Ambulatory Visit: Payer: Self-pay | Admitting: Neurology

## 2015-10-04 DIAGNOSIS — R112 Nausea with vomiting, unspecified: Secondary | ICD-10-CM | POA: Diagnosis not present

## 2015-10-04 DIAGNOSIS — I10 Essential (primary) hypertension: Secondary | ICD-10-CM | POA: Diagnosis not present

## 2015-10-09 DIAGNOSIS — R112 Nausea with vomiting, unspecified: Secondary | ICD-10-CM | POA: Diagnosis not present

## 2015-11-14 DIAGNOSIS — K6389 Other specified diseases of intestine: Secondary | ICD-10-CM | POA: Diagnosis not present

## 2015-11-14 DIAGNOSIS — R112 Nausea with vomiting, unspecified: Secondary | ICD-10-CM | POA: Diagnosis not present

## 2015-11-14 DIAGNOSIS — D110 Benign neoplasm of parotid gland: Secondary | ICD-10-CM | POA: Diagnosis not present

## 2015-11-19 DIAGNOSIS — F5101 Primary insomnia: Secondary | ICD-10-CM | POA: Diagnosis not present

## 2015-11-19 DIAGNOSIS — F329 Major depressive disorder, single episode, unspecified: Secondary | ICD-10-CM | POA: Diagnosis not present

## 2015-11-19 DIAGNOSIS — R1084 Generalized abdominal pain: Secondary | ICD-10-CM | POA: Diagnosis not present

## 2015-11-19 DIAGNOSIS — E782 Mixed hyperlipidemia: Secondary | ICD-10-CM | POA: Diagnosis not present

## 2015-11-19 DIAGNOSIS — I10 Essential (primary) hypertension: Secondary | ICD-10-CM | POA: Diagnosis not present

## 2015-11-19 DIAGNOSIS — M545 Low back pain: Secondary | ICD-10-CM | POA: Diagnosis not present

## 2015-11-26 DIAGNOSIS — R112 Nausea with vomiting, unspecified: Secondary | ICD-10-CM | POA: Diagnosis not present

## 2015-11-26 DIAGNOSIS — R935 Abnormal findings on diagnostic imaging of other abdominal regions, including retroperitoneum: Secondary | ICD-10-CM | POA: Diagnosis not present

## 2015-11-26 DIAGNOSIS — R590 Localized enlarged lymph nodes: Secondary | ICD-10-CM | POA: Diagnosis not present

## 2015-11-26 DIAGNOSIS — R1084 Generalized abdominal pain: Secondary | ICD-10-CM | POA: Diagnosis not present
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
## 2015-12-05 DIAGNOSIS — R221 Localized swelling, mass and lump, neck: Secondary | ICD-10-CM | POA: Diagnosis not present

## 2015-12-05 DIAGNOSIS — D110 Benign neoplasm of parotid gland: Secondary | ICD-10-CM | POA: Diagnosis not present

## 2015-12-07 DIAGNOSIS — K119 Disease of salivary gland, unspecified: Secondary | ICD-10-CM | POA: Diagnosis not present

## 2015-12-07 DIAGNOSIS — D110 Benign neoplasm of parotid gland: Secondary | ICD-10-CM | POA: Diagnosis not present

## 2015-12-07 DIAGNOSIS — F17200 Nicotine dependence, unspecified, uncomplicated: Secondary | ICD-10-CM | POA: Diagnosis not present

## 2015-12-07 DIAGNOSIS — D119 Benign neoplasm of major salivary gland, unspecified: Secondary | ICD-10-CM | POA: Diagnosis not present

## 2015-12-17 ENCOUNTER — Other Ambulatory Visit: Payer: Self-pay | Admitting: Neurology

## 2015-12-26 DIAGNOSIS — F419 Anxiety disorder, unspecified: Secondary | ICD-10-CM | POA: Diagnosis not present

## 2015-12-26 DIAGNOSIS — F17210 Nicotine dependence, cigarettes, uncomplicated: Secondary | ICD-10-CM | POA: Diagnosis not present

## 2015-12-26 DIAGNOSIS — R590 Localized enlarged lymph nodes: Secondary | ICD-10-CM | POA: Diagnosis not present

## 2015-12-26 DIAGNOSIS — J45909 Unspecified asthma, uncomplicated: Secondary | ICD-10-CM | POA: Diagnosis not present

## 2015-12-26 DIAGNOSIS — K2900 Acute gastritis without bleeding: Secondary | ICD-10-CM | POA: Diagnosis not present

## 2015-12-26 DIAGNOSIS — R1084 Generalized abdominal pain: Secondary | ICD-10-CM | POA: Diagnosis not present

## 2015-12-26 DIAGNOSIS — F329 Major depressive disorder, single episode, unspecified: Secondary | ICD-10-CM | POA: Diagnosis not present

## 2015-12-26 DIAGNOSIS — I10 Essential (primary) hypertension: Secondary | ICD-10-CM | POA: Diagnosis not present

## 2015-12-26 DIAGNOSIS — Z86010 Personal history of colonic polyps: Secondary | ICD-10-CM | POA: Diagnosis not present

## 2015-12-26 DIAGNOSIS — R112 Nausea with vomiting, unspecified: Secondary | ICD-10-CM | POA: Diagnosis not present

## 2015-12-26 DIAGNOSIS — K580 Irritable bowel syndrome with diarrhea: Secondary | ICD-10-CM | POA: Diagnosis not present

## 2015-12-26 DIAGNOSIS — Z9049 Acquired absence of other specified parts of digestive tract: Secondary | ICD-10-CM | POA: Diagnosis not present

## 2015-12-26 DIAGNOSIS — R935 Abnormal findings on diagnostic imaging of other abdominal regions, including retroperitoneum: Secondary | ICD-10-CM | POA: Diagnosis not present

## 2015-12-26 DIAGNOSIS — J449 Chronic obstructive pulmonary disease, unspecified: Secondary | ICD-10-CM | POA: Diagnosis not present

## 2015-12-26 DIAGNOSIS — Z79899 Other long term (current) drug therapy: Secondary | ICD-10-CM | POA: Diagnosis not present

## 2015-12-26 DIAGNOSIS — K2950 Unspecified chronic gastritis without bleeding: Secondary | ICD-10-CM | POA: Diagnosis not present

## 2016-01-01 DIAGNOSIS — I10 Essential (primary) hypertension: Secondary | ICD-10-CM | POA: Diagnosis not present

## 2016-01-07 ENCOUNTER — Ambulatory Visit: Payer: Self-pay | Admitting: Adult Health

## 2016-01-15 ENCOUNTER — Other Ambulatory Visit: Payer: Self-pay | Admitting: Neurology

## 2016-02-25 DIAGNOSIS — J069 Acute upper respiratory infection, unspecified: Secondary | ICD-10-CM | POA: Diagnosis not present

## 2016-03-03 DIAGNOSIS — J209 Acute bronchitis, unspecified: Secondary | ICD-10-CM | POA: Diagnosis not present

## 2016-05-22 DIAGNOSIS — H60311 Diffuse otitis externa, right ear: Secondary | ICD-10-CM | POA: Diagnosis not present

## 2016-05-29 DIAGNOSIS — F5101 Primary insomnia: Secondary | ICD-10-CM | POA: Diagnosis not present

## 2016-05-29 DIAGNOSIS — F329 Major depressive disorder, single episode, unspecified: Secondary | ICD-10-CM | POA: Diagnosis not present

## 2016-05-29 DIAGNOSIS — I10 Essential (primary) hypertension: Secondary | ICD-10-CM | POA: Diagnosis not present

## 2016-05-29 DIAGNOSIS — K219 Gastro-esophageal reflux disease without esophagitis: Secondary | ICD-10-CM | POA: Diagnosis not present

## 2016-06-18 DIAGNOSIS — H6091 Unspecified otitis externa, right ear: Secondary | ICD-10-CM | POA: Diagnosis not present

## 2016-06-19 DIAGNOSIS — Z1211 Encounter for screening for malignant neoplasm of colon: Secondary | ICD-10-CM | POA: Diagnosis not present

## 2016-06-19 DIAGNOSIS — Z8371 Family history of colonic polyps: Secondary | ICD-10-CM | POA: Diagnosis not present

## 2016-06-19 DIAGNOSIS — Z86010 Personal history of colonic polyps: Secondary | ICD-10-CM | POA: Diagnosis not present

## 2016-06-19 DIAGNOSIS — R590 Localized enlarged lymph nodes: Secondary | ICD-10-CM | POA: Diagnosis not present

## 2016-06-20 ENCOUNTER — Other Ambulatory Visit: Payer: Self-pay | Admitting: Neurology

## 2016-06-25 DIAGNOSIS — K760 Fatty (change of) liver, not elsewhere classified: Secondary | ICD-10-CM | POA: Diagnosis not present

## 2016-06-25 DIAGNOSIS — R590 Localized enlarged lymph nodes: Secondary | ICD-10-CM | POA: Diagnosis not present

## 2016-06-25 DIAGNOSIS — R109 Unspecified abdominal pain: Secondary | ICD-10-CM | POA: Diagnosis not present

## 2016-06-25 DIAGNOSIS — R1084 Generalized abdominal pain: Secondary | ICD-10-CM | POA: Diagnosis not present

## 2016-06-25 DIAGNOSIS — K429 Umbilical hernia without obstruction or gangrene: Secondary | ICD-10-CM | POA: Diagnosis not present

## 2016-06-25 DIAGNOSIS — I700 Atherosclerosis of aorta: Secondary | ICD-10-CM | POA: Diagnosis not present

## 2016-06-26 ENCOUNTER — Other Ambulatory Visit: Payer: Self-pay | Admitting: Neurology

## 2016-08-06 DIAGNOSIS — G43909 Migraine, unspecified, not intractable, without status migrainosus: Secondary | ICD-10-CM | POA: Diagnosis not present

## 2016-08-06 DIAGNOSIS — Z79899 Other long term (current) drug therapy: Secondary | ICD-10-CM | POA: Diagnosis not present

## 2016-08-06 DIAGNOSIS — I10 Essential (primary) hypertension: Secondary | ICD-10-CM | POA: Diagnosis not present

## 2016-08-06 DIAGNOSIS — E039 Hypothyroidism, unspecified: Secondary | ICD-10-CM | POA: Diagnosis not present

## 2016-08-06 DIAGNOSIS — D125 Benign neoplasm of sigmoid colon: Secondary | ICD-10-CM | POA: Diagnosis not present

## 2016-08-06 DIAGNOSIS — F329 Major depressive disorder, single episode, unspecified: Secondary | ICD-10-CM | POA: Diagnosis not present

## 2016-08-06 DIAGNOSIS — E669 Obesity, unspecified: Secondary | ICD-10-CM | POA: Diagnosis not present

## 2016-08-06 DIAGNOSIS — D123 Benign neoplasm of transverse colon: Secondary | ICD-10-CM | POA: Diagnosis not present

## 2016-08-06 DIAGNOSIS — J45909 Unspecified asthma, uncomplicated: Secondary | ICD-10-CM | POA: Diagnosis not present

## 2016-08-06 DIAGNOSIS — Z86010 Personal history of colonic polyps: Secondary | ICD-10-CM | POA: Diagnosis not present

## 2016-08-06 DIAGNOSIS — F17210 Nicotine dependence, cigarettes, uncomplicated: Secondary | ICD-10-CM | POA: Diagnosis not present

## 2016-08-06 DIAGNOSIS — Z8371 Family history of colonic polyps: Secondary | ICD-10-CM | POA: Diagnosis not present

## 2016-08-06 DIAGNOSIS — E785 Hyperlipidemia, unspecified: Secondary | ICD-10-CM | POA: Diagnosis not present

## 2016-08-06 DIAGNOSIS — Z1211 Encounter for screening for malignant neoplasm of colon: Secondary | ICD-10-CM | POA: Diagnosis not present

## 2016-08-06 DIAGNOSIS — K648 Other hemorrhoids: Secondary | ICD-10-CM | POA: Diagnosis not present

## 2016-08-06 DIAGNOSIS — J449 Chronic obstructive pulmonary disease, unspecified: Secondary | ICD-10-CM | POA: Diagnosis not present

## 2016-08-06 DIAGNOSIS — Z6838 Body mass index (BMI) 38.0-38.9, adult: Secondary | ICD-10-CM | POA: Diagnosis not present

## 2016-08-06 DIAGNOSIS — F419 Anxiety disorder, unspecified: Secondary | ICD-10-CM | POA: Diagnosis not present

## 2016-08-06 DIAGNOSIS — Z9049 Acquired absence of other specified parts of digestive tract: Secondary | ICD-10-CM | POA: Diagnosis not present

## 2016-08-06 DIAGNOSIS — K635 Polyp of colon: Secondary | ICD-10-CM | POA: Diagnosis not present

## 2016-08-06 DIAGNOSIS — K219 Gastro-esophageal reflux disease without esophagitis: Secondary | ICD-10-CM | POA: Diagnosis not present

## 2016-08-06 DIAGNOSIS — K5730 Diverticulosis of large intestine without perforation or abscess without bleeding: Secondary | ICD-10-CM | POA: Diagnosis not present

## 2016-09-01 ENCOUNTER — Other Ambulatory Visit: Payer: Self-pay | Admitting: Neurology

## 2016-10-29 DIAGNOSIS — L720 Epidermal cyst: Secondary | ICD-10-CM | POA: Diagnosis not present

## 2016-10-29 DIAGNOSIS — L820 Inflamed seborrheic keratosis: Secondary | ICD-10-CM | POA: Diagnosis not present

## 2016-11-21 DIAGNOSIS — F17200 Nicotine dependence, unspecified, uncomplicated: Secondary | ICD-10-CM | POA: Diagnosis not present

## 2016-11-21 DIAGNOSIS — F320 Major depressive disorder, single episode, mild: Secondary | ICD-10-CM | POA: Diagnosis not present

## 2016-11-21 DIAGNOSIS — D110 Benign neoplasm of parotid gland: Secondary | ICD-10-CM | POA: Diagnosis not present

## 2016-11-21 DIAGNOSIS — Z23 Encounter for immunization: Secondary | ICD-10-CM | POA: Diagnosis not present

## 2016-11-21 DIAGNOSIS — Z90710 Acquired absence of both cervix and uterus: Secondary | ICD-10-CM | POA: Diagnosis not present

## 2016-11-21 DIAGNOSIS — I10 Essential (primary) hypertension: Secondary | ICD-10-CM | POA: Diagnosis not present

## 2016-11-21 DIAGNOSIS — Z0000 Encounter for general adult medical examination without abnormal findings: Secondary | ICD-10-CM | POA: Diagnosis not present

## 2016-11-21 DIAGNOSIS — Z9181 History of falling: Secondary | ICD-10-CM | POA: Diagnosis not present

## 2016-11-21 DIAGNOSIS — F5101 Primary insomnia: Secondary | ICD-10-CM | POA: Diagnosis not present

## 2016-11-21 DIAGNOSIS — E042 Nontoxic multinodular goiter: Secondary | ICD-10-CM | POA: Diagnosis not present

## 2016-11-21 DIAGNOSIS — E669 Obesity, unspecified: Secondary | ICD-10-CM | POA: Diagnosis not present

## 2016-11-21 DIAGNOSIS — Z79891 Long term (current) use of opiate analgesic: Secondary | ICD-10-CM | POA: Diagnosis not present

## 2016-11-21 DIAGNOSIS — K219 Gastro-esophageal reflux disease without esophagitis: Secondary | ICD-10-CM | POA: Diagnosis not present

## 2016-11-27 DIAGNOSIS — D110 Benign neoplasm of parotid gland: Secondary | ICD-10-CM | POA: Diagnosis not present

## 2016-11-27 DIAGNOSIS — K118 Other diseases of salivary glands: Secondary | ICD-10-CM | POA: Diagnosis not present

## 2016-12-10 DIAGNOSIS — K118 Other diseases of salivary glands: Secondary | ICD-10-CM | POA: Diagnosis not present

## 2016-12-10 DIAGNOSIS — D110 Benign neoplasm of parotid gland: Secondary | ICD-10-CM | POA: Diagnosis not present

## 2016-12-17 DIAGNOSIS — M5440 Lumbago with sciatica, unspecified side: Secondary | ICD-10-CM | POA: Diagnosis not present

## 2016-12-23 DIAGNOSIS — J0100 Acute maxillary sinusitis, unspecified: Secondary | ICD-10-CM | POA: Diagnosis not present

## 2016-12-23 DIAGNOSIS — J209 Acute bronchitis, unspecified: Secondary | ICD-10-CM | POA: Diagnosis not present

## 2016-12-30 DIAGNOSIS — M545 Low back pain: Secondary | ICD-10-CM | POA: Diagnosis not present

## 2016-12-30 DIAGNOSIS — M5440 Lumbago with sciatica, unspecified side: Secondary | ICD-10-CM | POA: Diagnosis not present

## 2017-01-01 DIAGNOSIS — D110 Benign neoplasm of parotid gland: Secondary | ICD-10-CM | POA: Diagnosis not present

## 2017-01-01 DIAGNOSIS — K219 Gastro-esophageal reflux disease without esophagitis: Secondary | ICD-10-CM | POA: Diagnosis not present

## 2017-01-01 DIAGNOSIS — F329 Major depressive disorder, single episode, unspecified: Secondary | ICD-10-CM | POA: Diagnosis not present

## 2017-01-01 DIAGNOSIS — I10 Essential (primary) hypertension: Secondary | ICD-10-CM | POA: Diagnosis not present

## 2017-01-09 DIAGNOSIS — K118 Other diseases of salivary glands: Secondary | ICD-10-CM | POA: Diagnosis not present

## 2017-01-09 DIAGNOSIS — D110 Benign neoplasm of parotid gland: Secondary | ICD-10-CM | POA: Diagnosis not present

## 2017-05-04 DIAGNOSIS — Z6837 Body mass index (BMI) 37.0-37.9, adult: Secondary | ICD-10-CM | POA: Diagnosis not present

## 2017-05-04 DIAGNOSIS — E669 Obesity, unspecified: Secondary | ICD-10-CM | POA: Diagnosis not present

## 2017-05-04 DIAGNOSIS — I10 Essential (primary) hypertension: Secondary | ICD-10-CM | POA: Diagnosis not present

## 2017-05-04 DIAGNOSIS — F3340 Major depressive disorder, recurrent, in remission, unspecified: Secondary | ICD-10-CM | POA: Diagnosis not present

## 2017-05-08 DIAGNOSIS — H6190 Disorder of external ear, unspecified, unspecified ear: Secondary | ICD-10-CM | POA: Diagnosis not present

## 2017-08-03 DIAGNOSIS — Z6838 Body mass index (BMI) 38.0-38.9, adult: Secondary | ICD-10-CM | POA: Diagnosis not present

## 2017-08-03 DIAGNOSIS — E669 Obesity, unspecified: Secondary | ICD-10-CM | POA: Diagnosis not present

## 2017-08-03 DIAGNOSIS — H9191 Unspecified hearing loss, right ear: Secondary | ICD-10-CM | POA: Diagnosis not present

## 2017-08-03 DIAGNOSIS — D492 Neoplasm of unspecified behavior of bone, soft tissue, and skin: Secondary | ICD-10-CM | POA: Diagnosis not present

## 2017-08-03 DIAGNOSIS — I10 Essential (primary) hypertension: Secondary | ICD-10-CM | POA: Diagnosis not present

## 2017-08-27 DIAGNOSIS — H60392 Other infective otitis externa, left ear: Secondary | ICD-10-CM | POA: Diagnosis not present

## 2017-09-08 DIAGNOSIS — H60312 Diffuse otitis externa, left ear: Secondary | ICD-10-CM | POA: Diagnosis not present

## 2017-09-08 DIAGNOSIS — I10 Essential (primary) hypertension: Secondary | ICD-10-CM | POA: Diagnosis not present

## 2017-10-23 DIAGNOSIS — H9209 Otalgia, unspecified ear: Secondary | ICD-10-CM | POA: Diagnosis not present

## 2017-11-23 DIAGNOSIS — Z0000 Encounter for general adult medical examination without abnormal findings: Secondary | ICD-10-CM | POA: Diagnosis not present

## 2017-11-23 DIAGNOSIS — R110 Nausea: Secondary | ICD-10-CM | POA: Diagnosis not present

## 2017-11-23 DIAGNOSIS — Z23 Encounter for immunization: Secondary | ICD-10-CM | POA: Diagnosis not present

## 2017-11-23 DIAGNOSIS — Z6838 Body mass index (BMI) 38.0-38.9, adult: Secondary | ICD-10-CM | POA: Diagnosis not present

## 2017-11-23 DIAGNOSIS — Z90710 Acquired absence of both cervix and uterus: Secondary | ICD-10-CM | POA: Diagnosis not present

## 2017-11-23 DIAGNOSIS — I10 Essential (primary) hypertension: Secondary | ICD-10-CM | POA: Diagnosis not present

## 2017-11-23 DIAGNOSIS — F17200 Nicotine dependence, unspecified, uncomplicated: Secondary | ICD-10-CM | POA: Diagnosis not present

## 2017-11-23 DIAGNOSIS — R51 Headache: Secondary | ICD-10-CM | POA: Diagnosis not present

## 2017-11-23 DIAGNOSIS — Z1389 Encounter for screening for other disorder: Secondary | ICD-10-CM | POA: Diagnosis not present

## 2017-11-23 DIAGNOSIS — F330 Major depressive disorder, recurrent, mild: Secondary | ICD-10-CM | POA: Diagnosis not present

## 2017-11-23 DIAGNOSIS — K219 Gastro-esophageal reflux disease without esophagitis: Secondary | ICD-10-CM | POA: Diagnosis not present

## 2018-01-25 DIAGNOSIS — E782 Mixed hyperlipidemia: Secondary | ICD-10-CM | POA: Diagnosis not present

## 2018-01-25 DIAGNOSIS — Z6838 Body mass index (BMI) 38.0-38.9, adult: Secondary | ICD-10-CM | POA: Diagnosis not present

## 2018-01-25 DIAGNOSIS — K219 Gastro-esophageal reflux disease without esophagitis: Secondary | ICD-10-CM | POA: Diagnosis not present

## 2018-01-25 DIAGNOSIS — I10 Essential (primary) hypertension: Secondary | ICD-10-CM | POA: Diagnosis not present

## 2018-01-29 DIAGNOSIS — Z1231 Encounter for screening mammogram for malignant neoplasm of breast: Secondary | ICD-10-CM | POA: Diagnosis not present

## 2018-03-29 DIAGNOSIS — R1084 Generalized abdominal pain: Secondary | ICD-10-CM | POA: Diagnosis not present

## 2018-03-30 DIAGNOSIS — R1084 Generalized abdominal pain: Secondary | ICD-10-CM | POA: Diagnosis not present

## 2018-04-05 DIAGNOSIS — R1084 Generalized abdominal pain: Secondary | ICD-10-CM | POA: Diagnosis not present

## 2018-04-26 DIAGNOSIS — K219 Gastro-esophageal reflux disease without esophagitis: Secondary | ICD-10-CM | POA: Diagnosis not present

## 2018-04-26 DIAGNOSIS — F5101 Primary insomnia: Secondary | ICD-10-CM | POA: Diagnosis not present

## 2018-04-26 DIAGNOSIS — I10 Essential (primary) hypertension: Secondary | ICD-10-CM | POA: Diagnosis not present

## 2018-04-26 DIAGNOSIS — E7800 Pure hypercholesterolemia, unspecified: Secondary | ICD-10-CM | POA: Diagnosis not present

## 2018-08-04 DIAGNOSIS — I10 Essential (primary) hypertension: Secondary | ICD-10-CM | POA: Diagnosis not present

## 2018-08-04 DIAGNOSIS — E669 Obesity, unspecified: Secondary | ICD-10-CM | POA: Diagnosis not present

## 2018-08-04 DIAGNOSIS — Z6837 Body mass index (BMI) 37.0-37.9, adult: Secondary | ICD-10-CM | POA: Diagnosis not present

## 2018-08-04 DIAGNOSIS — E7800 Pure hypercholesterolemia, unspecified: Secondary | ICD-10-CM | POA: Diagnosis not present

## 2018-09-16 DIAGNOSIS — M542 Cervicalgia: Secondary | ICD-10-CM | POA: Diagnosis not present

## 2018-11-09 DIAGNOSIS — J0100 Acute maxillary sinusitis, unspecified: Secondary | ICD-10-CM | POA: Diagnosis not present

## 2018-12-01 DIAGNOSIS — I10 Essential (primary) hypertension: Secondary | ICD-10-CM | POA: Diagnosis not present

## 2018-12-01 DIAGNOSIS — Z23 Encounter for immunization: Secondary | ICD-10-CM | POA: Diagnosis not present

## 2018-12-01 DIAGNOSIS — Z90710 Acquired absence of both cervix and uterus: Secondary | ICD-10-CM | POA: Diagnosis not present

## 2018-12-01 DIAGNOSIS — F17200 Nicotine dependence, unspecified, uncomplicated: Secondary | ICD-10-CM | POA: Diagnosis not present

## 2018-12-01 DIAGNOSIS — Z0000 Encounter for general adult medical examination without abnormal findings: Secondary | ICD-10-CM | POA: Diagnosis not present

## 2018-12-01 DIAGNOSIS — K219 Gastro-esophageal reflux disease without esophagitis: Secondary | ICD-10-CM | POA: Diagnosis not present

## 2018-12-01 DIAGNOSIS — Z9181 History of falling: Secondary | ICD-10-CM | POA: Diagnosis not present

## 2018-12-01 DIAGNOSIS — F330 Major depressive disorder, recurrent, mild: Secondary | ICD-10-CM | POA: Diagnosis not present

## 2018-12-01 DIAGNOSIS — E7800 Pure hypercholesterolemia, unspecified: Secondary | ICD-10-CM | POA: Diagnosis not present

## 2018-12-01 DIAGNOSIS — E669 Obesity, unspecified: Secondary | ICD-10-CM | POA: Diagnosis not present

## 2019-01-11 DIAGNOSIS — F17200 Nicotine dependence, unspecified, uncomplicated: Secondary | ICD-10-CM | POA: Diagnosis not present

## 2019-01-11 DIAGNOSIS — F5101 Primary insomnia: Secondary | ICD-10-CM | POA: Diagnosis not present

## 2019-01-11 DIAGNOSIS — I10 Essential (primary) hypertension: Secondary | ICD-10-CM | POA: Diagnosis not present

## 2019-04-11 DIAGNOSIS — M545 Low back pain: Secondary | ICD-10-CM | POA: Diagnosis not present

## 2019-04-11 DIAGNOSIS — F330 Major depressive disorder, recurrent, mild: Secondary | ICD-10-CM | POA: Diagnosis not present

## 2019-04-11 DIAGNOSIS — F5101 Primary insomnia: Secondary | ICD-10-CM | POA: Diagnosis not present

## 2019-04-11 DIAGNOSIS — I10 Essential (primary) hypertension: Secondary | ICD-10-CM | POA: Diagnosis not present

## 2019-04-11 DIAGNOSIS — Z79899 Other long term (current) drug therapy: Secondary | ICD-10-CM | POA: Diagnosis not present

## 2019-04-11 DIAGNOSIS — K219 Gastro-esophageal reflux disease without esophagitis: Secondary | ICD-10-CM | POA: Diagnosis not present

## 2019-04-11 DIAGNOSIS — E782 Mixed hyperlipidemia: Secondary | ICD-10-CM | POA: Diagnosis not present

## 2019-04-11 DIAGNOSIS — H60392 Other infective otitis externa, left ear: Secondary | ICD-10-CM | POA: Diagnosis not present

## 2019-05-31 DIAGNOSIS — Z1231 Encounter for screening mammogram for malignant neoplasm of breast: Secondary | ICD-10-CM | POA: Diagnosis not present

## 2019-07-27 DIAGNOSIS — H60549 Acute eczematoid otitis externa, unspecified ear: Secondary | ICD-10-CM | POA: Diagnosis not present

## 2019-07-27 DIAGNOSIS — H6123 Impacted cerumen, bilateral: Secondary | ICD-10-CM | POA: Diagnosis not present

## 2019-07-27 DIAGNOSIS — H6060 Unspecified chronic otitis externa, unspecified ear: Secondary | ICD-10-CM | POA: Diagnosis not present

## 2019-08-08 DIAGNOSIS — I10 Essential (primary) hypertension: Secondary | ICD-10-CM | POA: Diagnosis not present

## 2019-08-08 DIAGNOSIS — Z6834 Body mass index (BMI) 34.0-34.9, adult: Secondary | ICD-10-CM | POA: Diagnosis not present

## 2019-08-08 DIAGNOSIS — Z86010 Personal history of colonic polyps: Secondary | ICD-10-CM | POA: Diagnosis not present

## 2019-08-08 DIAGNOSIS — E669 Obesity, unspecified: Secondary | ICD-10-CM | POA: Diagnosis not present

## 2019-08-10 DIAGNOSIS — H60331 Swimmer's ear, right ear: Secondary | ICD-10-CM | POA: Diagnosis not present

## 2019-08-22 DIAGNOSIS — Z86010 Personal history of colonic polyps: Secondary | ICD-10-CM | POA: Diagnosis not present

## 2019-08-24 DIAGNOSIS — H9011 Conductive hearing loss, unilateral, right ear, with unrestricted hearing on the contralateral side: Secondary | ICD-10-CM | POA: Diagnosis not present

## 2019-08-24 DIAGNOSIS — H9211 Otorrhea, right ear: Secondary | ICD-10-CM | POA: Diagnosis not present

## 2019-08-24 DIAGNOSIS — H60331 Swimmer's ear, right ear: Secondary | ICD-10-CM | POA: Diagnosis not present

## 2019-08-30 DIAGNOSIS — K635 Polyp of colon: Secondary | ICD-10-CM | POA: Diagnosis not present

## 2019-08-30 DIAGNOSIS — I10 Essential (primary) hypertension: Secondary | ICD-10-CM | POA: Diagnosis not present

## 2019-08-30 DIAGNOSIS — Z1211 Encounter for screening for malignant neoplasm of colon: Secondary | ICD-10-CM | POA: Diagnosis not present

## 2019-08-30 DIAGNOSIS — Z86010 Personal history of colonic polyps: Secondary | ICD-10-CM | POA: Diagnosis not present

## 2019-08-30 DIAGNOSIS — D123 Benign neoplasm of transverse colon: Secondary | ICD-10-CM | POA: Diagnosis not present

## 2019-08-30 DIAGNOSIS — F17200 Nicotine dependence, unspecified, uncomplicated: Secondary | ICD-10-CM | POA: Diagnosis not present

## 2019-08-31 DIAGNOSIS — H60331 Swimmer's ear, right ear: Secondary | ICD-10-CM | POA: Diagnosis not present

## 2019-08-31 DIAGNOSIS — H9211 Otorrhea, right ear: Secondary | ICD-10-CM | POA: Diagnosis not present

## 2019-09-19 DIAGNOSIS — H60331 Swimmer's ear, right ear: Secondary | ICD-10-CM | POA: Diagnosis not present

## 2019-10-21 DIAGNOSIS — H60331 Swimmer's ear, right ear: Secondary | ICD-10-CM | POA: Diagnosis not present

## 2019-10-21 DIAGNOSIS — M869 Osteomyelitis, unspecified: Secondary | ICD-10-CM | POA: Diagnosis not present

## 2019-10-21 DIAGNOSIS — H748X1 Other specified disorders of right middle ear and mastoid: Secondary | ICD-10-CM | POA: Diagnosis not present

## 2019-10-21 DIAGNOSIS — H9201 Otalgia, right ear: Secondary | ICD-10-CM | POA: Diagnosis not present

## 2019-12-07 DIAGNOSIS — H6121 Impacted cerumen, right ear: Secondary | ICD-10-CM | POA: Diagnosis not present

## 2019-12-07 DIAGNOSIS — H60331 Swimmer's ear, right ear: Secondary | ICD-10-CM | POA: Diagnosis not present

## 2019-12-09 DIAGNOSIS — H60331 Swimmer's ear, right ear: Secondary | ICD-10-CM | POA: Diagnosis not present

## 2020-01-04 DIAGNOSIS — F330 Major depressive disorder, recurrent, mild: Secondary | ICD-10-CM | POA: Diagnosis not present

## 2020-01-04 DIAGNOSIS — E669 Obesity, unspecified: Secondary | ICD-10-CM | POA: Diagnosis not present

## 2020-01-04 DIAGNOSIS — Z0000 Encounter for general adult medical examination without abnormal findings: Secondary | ICD-10-CM | POA: Diagnosis not present

## 2020-01-04 DIAGNOSIS — Z90710 Acquired absence of both cervix and uterus: Secondary | ICD-10-CM | POA: Diagnosis not present

## 2020-01-04 DIAGNOSIS — Z6836 Body mass index (BMI) 36.0-36.9, adult: Secondary | ICD-10-CM | POA: Diagnosis not present

## 2020-01-04 DIAGNOSIS — I10 Essential (primary) hypertension: Secondary | ICD-10-CM | POA: Diagnosis not present

## 2020-01-04 DIAGNOSIS — Z23 Encounter for immunization: Secondary | ICD-10-CM | POA: Diagnosis not present

## 2020-01-04 DIAGNOSIS — R21 Rash and other nonspecific skin eruption: Secondary | ICD-10-CM | POA: Diagnosis not present

## 2020-01-04 DIAGNOSIS — K219 Gastro-esophageal reflux disease without esophagitis: Secondary | ICD-10-CM | POA: Diagnosis not present

## 2020-03-20 DIAGNOSIS — H608X3 Other otitis externa, bilateral: Secondary | ICD-10-CM | POA: Diagnosis not present

## 2020-04-03 DIAGNOSIS — I10 Essential (primary) hypertension: Secondary | ICD-10-CM | POA: Diagnosis not present

## 2020-04-03 DIAGNOSIS — E7800 Pure hypercholesterolemia, unspecified: Secondary | ICD-10-CM | POA: Diagnosis not present

## 2020-04-03 DIAGNOSIS — F330 Major depressive disorder, recurrent, mild: Secondary | ICD-10-CM | POA: Diagnosis not present

## 2020-04-03 DIAGNOSIS — K219 Gastro-esophageal reflux disease without esophagitis: Secondary | ICD-10-CM | POA: Diagnosis not present

## 2020-04-03 DIAGNOSIS — Z6837 Body mass index (BMI) 37.0-37.9, adult: Secondary | ICD-10-CM | POA: Diagnosis not present

## 2020-05-22 DIAGNOSIS — M67843 Other specified disorders of tendon, right hand: Secondary | ICD-10-CM | POA: Diagnosis not present

## 2020-05-22 DIAGNOSIS — M79641 Pain in right hand: Secondary | ICD-10-CM | POA: Diagnosis not present

## 2020-05-22 DIAGNOSIS — M069 Rheumatoid arthritis, unspecified: Secondary | ICD-10-CM | POA: Diagnosis not present

## 2020-05-24 DIAGNOSIS — S66512A Strain of intrinsic muscle, fascia and tendon of right middle finger at wrist and hand level, initial encounter: Secondary | ICD-10-CM | POA: Diagnosis not present

## 2020-07-26 DIAGNOSIS — G510 Bell's palsy: Secondary | ICD-10-CM | POA: Diagnosis not present

## 2020-10-09 DIAGNOSIS — H65191 Other acute nonsuppurative otitis media, right ear: Secondary | ICD-10-CM | POA: Diagnosis not present

## 2020-10-09 DIAGNOSIS — J069 Acute upper respiratory infection, unspecified: Secondary | ICD-10-CM | POA: Diagnosis not present

## 2021-01-23 DIAGNOSIS — L249 Irritant contact dermatitis, unspecified cause: Secondary | ICD-10-CM | POA: Diagnosis not present

## 2021-01-24 DIAGNOSIS — Z79891 Long term (current) use of opiate analgesic: Secondary | ICD-10-CM | POA: Diagnosis not present

## 2021-01-24 DIAGNOSIS — Z0000 Encounter for general adult medical examination without abnormal findings: Secondary | ICD-10-CM | POA: Diagnosis not present

## 2021-01-24 DIAGNOSIS — I10 Essential (primary) hypertension: Secondary | ICD-10-CM | POA: Diagnosis not present

## 2021-04-29 DIAGNOSIS — I10 Essential (primary) hypertension: Secondary | ICD-10-CM | POA: Diagnosis not present

## 2021-04-29 DIAGNOSIS — E7800 Pure hypercholesterolemia, unspecified: Secondary | ICD-10-CM | POA: Diagnosis not present

## 2021-04-29 DIAGNOSIS — F330 Major depressive disorder, recurrent, mild: Secondary | ICD-10-CM | POA: Diagnosis not present

## 2021-04-29 DIAGNOSIS — K219 Gastro-esophageal reflux disease without esophagitis: Secondary | ICD-10-CM | POA: Diagnosis not present

## 2021-08-07 DIAGNOSIS — F330 Major depressive disorder, recurrent, mild: Secondary | ICD-10-CM | POA: Diagnosis not present

## 2021-08-07 DIAGNOSIS — E669 Obesity, unspecified: Secondary | ICD-10-CM | POA: Diagnosis not present

## 2021-08-07 DIAGNOSIS — E7800 Pure hypercholesterolemia, unspecified: Secondary | ICD-10-CM | POA: Diagnosis not present

## 2021-08-07 DIAGNOSIS — I10 Essential (primary) hypertension: Secondary | ICD-10-CM | POA: Diagnosis not present

## 2021-10-01 ENCOUNTER — Telehealth: Payer: Self-pay

## 2021-10-01 NOTE — Patient Outreach (Signed)
  Care Coordination   10/01/2021 Name: Megan Ochoa MRN: [id number] DOB: 09/19/1965   Care Coordination Outreach Attempts:  An unsuccessful telephone outreach was attempted today to offer the patient information about available care coordination services as a benefit of their health plan.   Follow Up Plan:  Additional outreach attempts will be made to offer the patient care coordination information and services.   Encounter Outcome:  No Answer  Care Coordination Interventions Activated:  No   Care Coordination Interventions:  No, not indicated   Tomasa Rand, RN, BSN, CEN [facility] [employer] [phone]

## 2021-10-31 ENCOUNTER — Telehealth: Payer: Self-pay

## 2021-10-31 NOTE — Patient Outreach (Signed)
  Care Coordination   10/31/2021 Name: Megan Ochoa MRN: [id number] DOB: [birth year]   Care Coordination Outreach Attempts:  A second unsuccessful outreach was attempted today to offer the patient with information about available care coordination services as a benefit of their health plan.     Follow Up Plan:  Additional outreach attempts will be made to offer the patient care coordination information and services.   Encounter Outcome:  No Answer  Care Coordination Interventions Activated:  No   Care Coordination Interventions:  No, not indicated    Tomasa Rand, RN, BSN, CEN [facility] [employer] [phone]

## 2021-11-04 ENCOUNTER — Telehealth: Payer: Self-pay

## 2021-11-04 NOTE — Patient Outreach (Signed)
  Care Coordination   11/04/2021 Name: Megan Ochoa MRN: [id number] DOB: [birth year]   Care Coordination Outreach Attempts:  A third unsuccessful outreach was attempted today to offer the patient with information about available care coordination services as a benefit of their health plan.   Follow Up Plan:  No further outreach attempts will be made at this time. We have been unable to contact the patient to offer or enroll patient in care coordination services  Encounter Outcome:  No Answer  Care Coordination Interventions Activated:  No   Care Coordination Interventions:  No, not indicated    Tomasa Rand, RN, BSN, CEN [place] Coordinator [phone]

## 2021-11-06 DIAGNOSIS — Z1231 Encounter for screening mammogram for malignant neoplasm of breast: Secondary | ICD-10-CM | POA: Diagnosis not present

## 2021-11-07 DIAGNOSIS — E669 Obesity, unspecified: Secondary | ICD-10-CM | POA: Diagnosis not present

## 2021-11-07 DIAGNOSIS — I10 Essential (primary) hypertension: Secondary | ICD-10-CM | POA: Diagnosis not present

## 2021-11-07 DIAGNOSIS — E7800 Pure hypercholesterolemia, unspecified: Secondary | ICD-10-CM | POA: Diagnosis not present

## 2021-11-07 DIAGNOSIS — F330 Major depressive disorder, recurrent, mild: Secondary | ICD-10-CM | POA: Diagnosis not present

## 2021-12-26 DIAGNOSIS — F330 Major depressive disorder, recurrent, mild: Secondary | ICD-10-CM | POA: Diagnosis not present

## 2021-12-26 DIAGNOSIS — I10 Essential (primary) hypertension: Secondary | ICD-10-CM | POA: Diagnosis not present

## 2021-12-30 DIAGNOSIS — K7460 Unspecified cirrhosis of liver: Secondary | ICD-10-CM | POA: Diagnosis not present

## 2021-12-30 DIAGNOSIS — E278 Other specified disorders of adrenal gland: Secondary | ICD-10-CM | POA: Diagnosis not present

## 2021-12-30 DIAGNOSIS — F17200 Nicotine dependence, unspecified, uncomplicated: Secondary | ICD-10-CM | POA: Diagnosis not present

## 2021-12-30 DIAGNOSIS — R1012 Left upper quadrant pain: Secondary | ICD-10-CM | POA: Diagnosis not present

## 2021-12-30 DIAGNOSIS — K429 Umbilical hernia without obstruction or gangrene: Secondary | ICD-10-CM | POA: Diagnosis not present

## 2021-12-30 DIAGNOSIS — R197 Diarrhea, unspecified: Secondary | ICD-10-CM | POA: Diagnosis not present

## 2021-12-30 DIAGNOSIS — R112 Nausea with vomiting, unspecified: Secondary | ICD-10-CM | POA: Diagnosis not present

## 2021-12-30 DIAGNOSIS — N3289 Other specified disorders of bladder: Secondary | ICD-10-CM | POA: Diagnosis not present

## 2022-02-26 DIAGNOSIS — R590 Localized enlarged lymph nodes: Secondary | ICD-10-CM | POA: Diagnosis not present

## 2022-02-26 DIAGNOSIS — Z122 Encounter for screening for malignant neoplasm of respiratory organs: Secondary | ICD-10-CM | POA: Diagnosis not present

## 2022-02-26 DIAGNOSIS — K7460 Unspecified cirrhosis of liver: Secondary | ICD-10-CM | POA: Diagnosis not present

## 2022-02-26 DIAGNOSIS — R911 Solitary pulmonary nodule: Secondary | ICD-10-CM | POA: Diagnosis not present

## 2022-02-26 DIAGNOSIS — J439 Emphysema, unspecified: Secondary | ICD-10-CM | POA: Diagnosis not present

## 2022-02-26 DIAGNOSIS — Z87891 Personal history of nicotine dependence: Secondary | ICD-10-CM | POA: Diagnosis not present

## 2022-03-19 DIAGNOSIS — K5900 Constipation, unspecified: Secondary | ICD-10-CM | POA: Diagnosis not present

## 2022-03-19 DIAGNOSIS — Z7984 Long term (current) use of oral hypoglycemic drugs: Secondary | ICD-10-CM | POA: Diagnosis not present

## 2022-03-19 DIAGNOSIS — Z8249 Family history of ischemic heart disease and other diseases of the circulatory system: Secondary | ICD-10-CM | POA: Diagnosis not present

## 2022-03-19 DIAGNOSIS — I10 Essential (primary) hypertension: Secondary | ICD-10-CM | POA: Diagnosis not present

## 2022-03-19 DIAGNOSIS — Z803 Family history of malignant neoplasm of breast: Secondary | ICD-10-CM | POA: Diagnosis not present

## 2022-03-19 DIAGNOSIS — J449 Chronic obstructive pulmonary disease, unspecified: Secondary | ICD-10-CM | POA: Diagnosis not present

## 2022-03-19 DIAGNOSIS — G4700 Insomnia, unspecified: Secondary | ICD-10-CM | POA: Diagnosis not present

## 2022-03-19 DIAGNOSIS — Z823 Family history of stroke: Secondary | ICD-10-CM | POA: Diagnosis not present

## 2022-03-19 DIAGNOSIS — Z87891 Personal history of nicotine dependence: Secondary | ICD-10-CM | POA: Diagnosis not present

## 2022-03-19 DIAGNOSIS — E1140 Type 2 diabetes mellitus with diabetic neuropathy, unspecified: Secondary | ICD-10-CM | POA: Diagnosis not present

## 2022-03-19 DIAGNOSIS — E785 Hyperlipidemia, unspecified: Secondary | ICD-10-CM | POA: Diagnosis not present

## 2022-03-24 DIAGNOSIS — R69 Illness, unspecified: Secondary | ICD-10-CM | POA: Diagnosis not present

## 2022-03-24 DIAGNOSIS — E1169 Type 2 diabetes mellitus with other specified complication: Secondary | ICD-10-CM | POA: Diagnosis not present

## 2022-03-24 DIAGNOSIS — K219 Gastro-esophageal reflux disease without esophagitis: Secondary | ICD-10-CM | POA: Diagnosis not present

## 2022-03-24 DIAGNOSIS — E7800 Pure hypercholesterolemia, unspecified: Secondary | ICD-10-CM | POA: Diagnosis not present

## 2022-03-24 DIAGNOSIS — I10 Essential (primary) hypertension: Secondary | ICD-10-CM | POA: Diagnosis not present

## 2022-04-08 DIAGNOSIS — Z135 Encounter for screening for eye and ear disorders: Secondary | ICD-10-CM | POA: Diagnosis not present

## 2022-04-08 DIAGNOSIS — E119 Type 2 diabetes mellitus without complications: Secondary | ICD-10-CM | POA: Diagnosis not present

## 2022-04-08 DIAGNOSIS — H5213 Myopia, bilateral: Secondary | ICD-10-CM | POA: Diagnosis not present

## 2022-06-19 DIAGNOSIS — E7800 Pure hypercholesterolemia, unspecified: Secondary | ICD-10-CM | POA: Diagnosis not present

## 2022-06-19 DIAGNOSIS — I10 Essential (primary) hypertension: Secondary | ICD-10-CM | POA: Diagnosis not present

## 2022-06-19 DIAGNOSIS — Z6834 Body mass index (BMI) 34.0-34.9, adult: Secondary | ICD-10-CM | POA: Diagnosis not present

## 2022-06-19 DIAGNOSIS — E1169 Type 2 diabetes mellitus with other specified complication: Secondary | ICD-10-CM | POA: Diagnosis not present

## 2022-06-19 DIAGNOSIS — E669 Obesity, unspecified: Secondary | ICD-10-CM | POA: Diagnosis not present

## 2022-06-19 DIAGNOSIS — F330 Major depressive disorder, recurrent, mild: Secondary | ICD-10-CM | POA: Diagnosis not present

## 2022-09-11 DIAGNOSIS — J984 Other disorders of lung: Secondary | ICD-10-CM | POA: Diagnosis not present

## 2022-09-11 DIAGNOSIS — J432 Centrilobular emphysema: Secondary | ICD-10-CM | POA: Diagnosis not present

## 2022-09-11 DIAGNOSIS — R911 Solitary pulmonary nodule: Secondary | ICD-10-CM | POA: Diagnosis not present

## 2022-09-18 DIAGNOSIS — E7800 Pure hypercholesterolemia, unspecified: Secondary | ICD-10-CM | POA: Diagnosis not present

## 2022-09-18 DIAGNOSIS — Z1231 Encounter for screening mammogram for malignant neoplasm of breast: Secondary | ICD-10-CM | POA: Diagnosis not present

## 2022-09-18 DIAGNOSIS — F330 Major depressive disorder, recurrent, mild: Secondary | ICD-10-CM | POA: Diagnosis not present

## 2022-09-18 DIAGNOSIS — E1169 Type 2 diabetes mellitus with other specified complication: Secondary | ICD-10-CM | POA: Diagnosis not present

## 2022-09-18 DIAGNOSIS — Z1159 Encounter for screening for other viral diseases: Secondary | ICD-10-CM | POA: Diagnosis not present

## 2022-09-18 DIAGNOSIS — Z6834 Body mass index (BMI) 34.0-34.9, adult: Secondary | ICD-10-CM | POA: Diagnosis not present

## 2022-09-18 DIAGNOSIS — E669 Obesity, unspecified: Secondary | ICD-10-CM | POA: Diagnosis not present

## 2022-09-18 DIAGNOSIS — I10 Essential (primary) hypertension: Secondary | ICD-10-CM | POA: Diagnosis not present

## 2022-09-30 DIAGNOSIS — R1310 Dysphagia, unspecified: Secondary | ICD-10-CM | POA: Diagnosis not present

## 2023-01-09 DIAGNOSIS — Z0000 Encounter for general adult medical examination without abnormal findings: Secondary | ICD-10-CM | POA: Diagnosis not present

## 2023-01-09 DIAGNOSIS — E7800 Pure hypercholesterolemia, unspecified: Secondary | ICD-10-CM | POA: Diagnosis not present

## 2023-01-09 DIAGNOSIS — E1169 Type 2 diabetes mellitus with other specified complication: Secondary | ICD-10-CM | POA: Diagnosis not present

## 2023-01-09 DIAGNOSIS — I10 Essential (primary) hypertension: Secondary | ICD-10-CM | POA: Diagnosis not present

## 2023-02-05 DIAGNOSIS — Z23 Encounter for immunization: Secondary | ICD-10-CM | POA: Diagnosis not present

## 2023-02-05 DIAGNOSIS — Z6834 Body mass index (BMI) 34.0-34.9, adult: Secondary | ICD-10-CM | POA: Diagnosis not present

## 2023-02-05 DIAGNOSIS — R599 Enlarged lymph nodes, unspecified: Secondary | ICD-10-CM | POA: Diagnosis not present

## 2023-03-03 DIAGNOSIS — Z833 Family history of diabetes mellitus: Secondary | ICD-10-CM | POA: Diagnosis not present

## 2023-03-03 DIAGNOSIS — F320 Major depressive disorder, single episode, mild: Secondary | ICD-10-CM | POA: Diagnosis not present

## 2023-03-03 DIAGNOSIS — Z809 Family history of malignant neoplasm, unspecified: Secondary | ICD-10-CM | POA: Diagnosis not present

## 2023-03-03 DIAGNOSIS — G40909 Epilepsy, unspecified, not intractable, without status epilepticus: Secondary | ICD-10-CM | POA: Diagnosis not present

## 2023-03-03 DIAGNOSIS — Z8249 Family history of ischemic heart disease and other diseases of the circulatory system: Secondary | ICD-10-CM | POA: Diagnosis not present

## 2023-03-03 DIAGNOSIS — J439 Emphysema, unspecified: Secondary | ICD-10-CM | POA: Diagnosis not present

## 2023-03-03 DIAGNOSIS — E785 Hyperlipidemia, unspecified: Secondary | ICD-10-CM | POA: Diagnosis not present

## 2023-03-03 DIAGNOSIS — E669 Obesity, unspecified: Secondary | ICD-10-CM | POA: Diagnosis not present

## 2023-03-03 DIAGNOSIS — K219 Gastro-esophageal reflux disease without esophagitis: Secondary | ICD-10-CM | POA: Diagnosis not present

## 2023-03-03 DIAGNOSIS — Z87891 Personal history of nicotine dependence: Secondary | ICD-10-CM | POA: Diagnosis not present

## 2023-03-03 DIAGNOSIS — K7460 Unspecified cirrhosis of liver: Secondary | ICD-10-CM | POA: Diagnosis not present

## 2023-03-03 DIAGNOSIS — E1142 Type 2 diabetes mellitus with diabetic polyneuropathy: Secondary | ICD-10-CM | POA: Diagnosis not present

## 2023-04-09 DIAGNOSIS — E7800 Pure hypercholesterolemia, unspecified: Secondary | ICD-10-CM | POA: Diagnosis not present

## 2023-04-09 DIAGNOSIS — E1169 Type 2 diabetes mellitus with other specified complication: Secondary | ICD-10-CM | POA: Diagnosis not present

## 2023-04-09 DIAGNOSIS — I10 Essential (primary) hypertension: Secondary | ICD-10-CM | POA: Diagnosis not present

## 2023-05-06 DIAGNOSIS — N644 Mastodynia: Secondary | ICD-10-CM | POA: Diagnosis not present

## 2023-05-06 DIAGNOSIS — R92313 Mammographic fatty tissue density, bilateral breasts: Secondary | ICD-10-CM | POA: Diagnosis not present

## 2023-07-09 DIAGNOSIS — E1169 Type 2 diabetes mellitus with other specified complication: Secondary | ICD-10-CM | POA: Diagnosis not present

## 2023-07-09 DIAGNOSIS — F330 Major depressive disorder, recurrent, mild: Secondary | ICD-10-CM | POA: Diagnosis not present

## 2023-07-09 DIAGNOSIS — J449 Chronic obstructive pulmonary disease, unspecified: Secondary | ICD-10-CM | POA: Diagnosis not present

## 2023-07-09 DIAGNOSIS — E119 Type 2 diabetes mellitus without complications: Secondary | ICD-10-CM | POA: Diagnosis not present

## 2023-07-09 DIAGNOSIS — Z6835 Body mass index (BMI) 35.0-35.9, adult: Secondary | ICD-10-CM | POA: Diagnosis not present

## 2023-07-09 DIAGNOSIS — I10 Essential (primary) hypertension: Secondary | ICD-10-CM | POA: Diagnosis not present

## 2023-07-09 DIAGNOSIS — E7800 Pure hypercholesterolemia, unspecified: Secondary | ICD-10-CM | POA: Diagnosis not present

## 2023-07-27 DIAGNOSIS — F330 Major depressive disorder, recurrent, mild: Secondary | ICD-10-CM | POA: Diagnosis not present

## 2023-07-27 DIAGNOSIS — E119 Type 2 diabetes mellitus without complications: Secondary | ICD-10-CM | POA: Diagnosis not present

## 2023-07-27 DIAGNOSIS — I10 Essential (primary) hypertension: Secondary | ICD-10-CM | POA: Diagnosis not present

## 2023-07-27 DIAGNOSIS — E7800 Pure hypercholesterolemia, unspecified: Secondary | ICD-10-CM | POA: Diagnosis not present

## 2023-07-27 DIAGNOSIS — Z79899 Other long term (current) drug therapy: Secondary | ICD-10-CM | POA: Diagnosis not present

## 2023-07-27 DIAGNOSIS — J449 Chronic obstructive pulmonary disease, unspecified: Secondary | ICD-10-CM | POA: Diagnosis not present

## 2023-08-27 DIAGNOSIS — K219 Gastro-esophageal reflux disease without esophagitis: Secondary | ICD-10-CM | POA: Diagnosis not present

## 2023-08-27 DIAGNOSIS — E7800 Pure hypercholesterolemia, unspecified: Secondary | ICD-10-CM | POA: Diagnosis not present

## 2023-08-27 DIAGNOSIS — E119 Type 2 diabetes mellitus without complications: Secondary | ICD-10-CM | POA: Diagnosis not present

## 2023-08-27 DIAGNOSIS — Z79899 Other long term (current) drug therapy: Secondary | ICD-10-CM | POA: Diagnosis not present

## 2023-09-27 DIAGNOSIS — I10 Essential (primary) hypertension: Secondary | ICD-10-CM | POA: Diagnosis not present

## 2023-09-27 DIAGNOSIS — J449 Chronic obstructive pulmonary disease, unspecified: Secondary | ICD-10-CM | POA: Diagnosis not present

## 2023-09-27 DIAGNOSIS — Z79899 Other long term (current) drug therapy: Secondary | ICD-10-CM | POA: Diagnosis not present

## 2023-09-27 DIAGNOSIS — E7800 Pure hypercholesterolemia, unspecified: Secondary | ICD-10-CM | POA: Diagnosis not present

## 2023-09-27 DIAGNOSIS — K219 Gastro-esophageal reflux disease without esophagitis: Secondary | ICD-10-CM | POA: Diagnosis not present

## 2023-09-27 DIAGNOSIS — Z79891 Long term (current) use of opiate analgesic: Secondary | ICD-10-CM | POA: Diagnosis not present

## 2023-09-27 DIAGNOSIS — F330 Major depressive disorder, recurrent, mild: Secondary | ICD-10-CM | POA: Diagnosis not present

## 2023-09-27 DIAGNOSIS — E119 Type 2 diabetes mellitus without complications: Secondary | ICD-10-CM | POA: Diagnosis not present

## 2023-10-08 DIAGNOSIS — E7800 Pure hypercholesterolemia, unspecified: Secondary | ICD-10-CM | POA: Diagnosis not present

## 2023-10-08 DIAGNOSIS — K219 Gastro-esophageal reflux disease without esophagitis: Secondary | ICD-10-CM | POA: Diagnosis not present

## 2023-10-08 DIAGNOSIS — F330 Major depressive disorder, recurrent, mild: Secondary | ICD-10-CM | POA: Diagnosis not present

## 2023-10-08 DIAGNOSIS — E119 Type 2 diabetes mellitus without complications: Secondary | ICD-10-CM | POA: Diagnosis not present

## 2023-10-08 DIAGNOSIS — E782 Mixed hyperlipidemia: Secondary | ICD-10-CM | POA: Diagnosis not present

## 2023-10-08 DIAGNOSIS — I10 Essential (primary) hypertension: Secondary | ICD-10-CM | POA: Diagnosis not present

## 2023-10-27 DIAGNOSIS — E1169 Type 2 diabetes mellitus with other specified complication: Secondary | ICD-10-CM | POA: Diagnosis not present

## 2023-10-27 DIAGNOSIS — K219 Gastro-esophageal reflux disease without esophagitis: Secondary | ICD-10-CM | POA: Diagnosis not present

## 2023-10-27 DIAGNOSIS — Z79899 Other long term (current) drug therapy: Secondary | ICD-10-CM | POA: Diagnosis not present

## 2023-10-27 DIAGNOSIS — E7800 Pure hypercholesterolemia, unspecified: Secondary | ICD-10-CM | POA: Diagnosis not present

## 2023-10-27 DIAGNOSIS — J449 Chronic obstructive pulmonary disease, unspecified: Secondary | ICD-10-CM | POA: Diagnosis not present

## 2023-11-10 DIAGNOSIS — W28XXXA Contact with powered lawn mower, initial encounter: Secondary | ICD-10-CM | POA: Diagnosis not present

## 2023-11-10 DIAGNOSIS — S4992XA Unspecified injury of left shoulder and upper arm, initial encounter: Secondary | ICD-10-CM | POA: Diagnosis not present

## 2023-11-10 DIAGNOSIS — R110 Nausea: Secondary | ICD-10-CM | POA: Diagnosis not present

## 2023-11-10 DIAGNOSIS — S42202A Unspecified fracture of upper end of left humerus, initial encounter for closed fracture: Secondary | ICD-10-CM | POA: Diagnosis not present

## 2023-11-10 DIAGNOSIS — R2232 Localized swelling, mass and lump, left upper limb: Secondary | ICD-10-CM | POA: Diagnosis not present

## 2023-11-10 DIAGNOSIS — M25512 Pain in left shoulder: Secondary | ICD-10-CM | POA: Diagnosis not present

## 2023-11-11 DIAGNOSIS — S42202A Unspecified fracture of upper end of left humerus, initial encounter for closed fracture: Secondary | ICD-10-CM | POA: Diagnosis not present

## 2023-11-11 DIAGNOSIS — Y999 Unspecified external cause status: Secondary | ICD-10-CM | POA: Diagnosis not present

## 2023-11-11 DIAGNOSIS — Z01818 Encounter for other preprocedural examination: Secondary | ICD-10-CM | POA: Diagnosis not present

## 2023-11-11 DIAGNOSIS — M25412 Effusion, left shoulder: Secondary | ICD-10-CM | POA: Diagnosis not present

## 2023-11-16 DIAGNOSIS — S42202A Unspecified fracture of upper end of left humerus, initial encounter for closed fracture: Secondary | ICD-10-CM | POA: Diagnosis not present

## 2023-11-27 DIAGNOSIS — E7800 Pure hypercholesterolemia, unspecified: Secondary | ICD-10-CM | POA: Diagnosis not present

## 2023-11-27 DIAGNOSIS — K219 Gastro-esophageal reflux disease without esophagitis: Secondary | ICD-10-CM | POA: Diagnosis not present

## 2023-11-27 DIAGNOSIS — E119 Type 2 diabetes mellitus without complications: Secondary | ICD-10-CM | POA: Diagnosis not present

## 2023-11-27 DIAGNOSIS — Z79899 Other long term (current) drug therapy: Secondary | ICD-10-CM | POA: Diagnosis not present

## 2023-11-27 DIAGNOSIS — J449 Chronic obstructive pulmonary disease, unspecified: Secondary | ICD-10-CM | POA: Diagnosis not present

## 2023-11-27 DIAGNOSIS — Z79891 Long term (current) use of opiate analgesic: Secondary | ICD-10-CM | POA: Diagnosis not present

## 2023-11-30 DIAGNOSIS — E119 Type 2 diabetes mellitus without complications: Secondary | ICD-10-CM | POA: Diagnosis not present

## 2023-11-30 DIAGNOSIS — H5213 Myopia, bilateral: Secondary | ICD-10-CM | POA: Diagnosis not present

## 2023-11-30 DIAGNOSIS — Z135 Encounter for screening for eye and ear disorders: Secondary | ICD-10-CM | POA: Diagnosis not present

## 2023-12-10 DIAGNOSIS — S42202A Unspecified fracture of upper end of left humerus, initial encounter for closed fracture: Secondary | ICD-10-CM | POA: Diagnosis not present

## 2023-12-10 DIAGNOSIS — S42202D Unspecified fracture of upper end of left humerus, subsequent encounter for fracture with routine healing: Secondary | ICD-10-CM | POA: Diagnosis not present

## 2023-12-11 DIAGNOSIS — S42202D Unspecified fracture of upper end of left humerus, subsequent encounter for fracture with routine healing: Secondary | ICD-10-CM | POA: Diagnosis not present

## 2023-12-27 DIAGNOSIS — Z79891 Long term (current) use of opiate analgesic: Secondary | ICD-10-CM | POA: Diagnosis not present

## 2023-12-27 DIAGNOSIS — F330 Major depressive disorder, recurrent, mild: Secondary | ICD-10-CM | POA: Diagnosis not present

## 2023-12-27 DIAGNOSIS — K219 Gastro-esophageal reflux disease without esophagitis: Secondary | ICD-10-CM | POA: Diagnosis not present

## 2023-12-27 DIAGNOSIS — Z79899 Other long term (current) drug therapy: Secondary | ICD-10-CM | POA: Diagnosis not present

## 2023-12-27 DIAGNOSIS — E7800 Pure hypercholesterolemia, unspecified: Secondary | ICD-10-CM | POA: Diagnosis not present

## 2024-01-01 DIAGNOSIS — S42202D Unspecified fracture of upper end of left humerus, subsequent encounter for fracture with routine healing: Secondary | ICD-10-CM | POA: Diagnosis not present
